# Patient Record
Sex: Female | Born: 1970 | Race: White | Hispanic: No | Marital: Married | Smoking: Former smoker
Health system: Southern US, Community
[De-identification: ages and names within clinical notes are randomized; demographics above are authoritative.]

## PROBLEM LIST (undated history)

## (undated) ENCOUNTER — Ambulatory Visit: Admission: EM | Payer: BC Managed Care – PPO

## (undated) DIAGNOSIS — R011 Cardiac murmur, unspecified: Secondary | ICD-10-CM

## (undated) HISTORY — PX: TONSILLECTOMY: SUR1361

## (undated) HISTORY — DX: Cardiac murmur, unspecified: R01.1

## (undated) HISTORY — PX: ABDOMINAL HYSTERECTOMY: SHX81

---

## 1997-05-01 ENCOUNTER — Other Ambulatory Visit: Admission: RE | Admit: 1997-05-01 | Discharge: 1997-05-01 | Payer: Self-pay | Admitting: Obstetrics & Gynecology

## 1999-05-05 ENCOUNTER — Other Ambulatory Visit: Admission: RE | Admit: 1999-05-05 | Discharge: 1999-05-05 | Payer: Self-pay | Admitting: Obstetrics & Gynecology

## 2000-06-02 ENCOUNTER — Other Ambulatory Visit: Admission: RE | Admit: 2000-06-02 | Discharge: 2000-06-02 | Payer: Self-pay | Admitting: Obstetrics & Gynecology

## 2000-09-06 ENCOUNTER — Other Ambulatory Visit: Admission: RE | Admit: 2000-09-06 | Discharge: 2000-09-06 | Payer: Self-pay | Admitting: Obstetrics & Gynecology

## 2001-02-10 ENCOUNTER — Other Ambulatory Visit: Admission: RE | Admit: 2001-02-10 | Discharge: 2001-02-10 | Payer: Self-pay | Admitting: Obstetrics & Gynecology

## 2001-06-07 ENCOUNTER — Other Ambulatory Visit: Admission: RE | Admit: 2001-06-07 | Discharge: 2001-06-07 | Payer: Self-pay | Admitting: Obstetrics & Gynecology

## 2001-11-29 ENCOUNTER — Other Ambulatory Visit: Admission: RE | Admit: 2001-11-29 | Discharge: 2001-11-29 | Payer: Self-pay | Admitting: Obstetrics and Gynecology

## 2002-04-11 ENCOUNTER — Other Ambulatory Visit: Admission: RE | Admit: 2002-04-11 | Discharge: 2002-04-11 | Payer: Self-pay | Admitting: Obstetrics & Gynecology

## 2002-08-16 ENCOUNTER — Other Ambulatory Visit: Admission: RE | Admit: 2002-08-16 | Discharge: 2002-08-16 | Payer: Self-pay | Admitting: Obstetrics & Gynecology

## 2002-12-01 ENCOUNTER — Other Ambulatory Visit: Admission: RE | Admit: 2002-12-01 | Discharge: 2002-12-01 | Payer: Self-pay | Admitting: Obstetrics & Gynecology

## 2003-05-22 ENCOUNTER — Other Ambulatory Visit: Admission: RE | Admit: 2003-05-22 | Discharge: 2003-05-22 | Payer: Self-pay | Admitting: Obstetrics & Gynecology

## 2003-12-04 ENCOUNTER — Other Ambulatory Visit: Admission: RE | Admit: 2003-12-04 | Discharge: 2003-12-04 | Payer: Self-pay | Admitting: Obstetrics & Gynecology

## 2004-09-10 ENCOUNTER — Other Ambulatory Visit: Admission: RE | Admit: 2004-09-10 | Discharge: 2004-09-10 | Payer: Self-pay | Admitting: Obstetrics & Gynecology

## 2005-03-17 ENCOUNTER — Inpatient Hospital Stay (HOSPITAL_COMMUNITY): Admission: AD | Admit: 2005-03-17 | Discharge: 2005-03-17 | Payer: Self-pay | Admitting: Obstetrics & Gynecology

## 2005-03-20 ENCOUNTER — Inpatient Hospital Stay (HOSPITAL_COMMUNITY): Admission: AD | Admit: 2005-03-20 | Discharge: 2005-03-20 | Payer: Self-pay | Admitting: Obstetrics and Gynecology

## 2005-03-31 ENCOUNTER — Inpatient Hospital Stay (HOSPITAL_COMMUNITY): Admission: AD | Admit: 2005-03-31 | Discharge: 2005-04-02 | Payer: Self-pay | Admitting: Obstetrics and Gynecology

## 2005-04-30 ENCOUNTER — Other Ambulatory Visit: Admission: RE | Admit: 2005-04-30 | Discharge: 2005-04-30 | Payer: Self-pay | Admitting: Obstetrics & Gynecology

## 2012-05-04 ENCOUNTER — Telehealth: Payer: Self-pay | Admitting: Nurse Practitioner

## 2012-05-04 NOTE — Telephone Encounter (Signed)
wtbs today for sore throat, facial pressure.

## 2012-05-05 ENCOUNTER — Ambulatory Visit (INDEPENDENT_AMBULATORY_CARE_PROVIDER_SITE_OTHER): Payer: BC Managed Care – PPO | Admitting: General Practice

## 2012-05-05 ENCOUNTER — Other Ambulatory Visit: Payer: Self-pay | Admitting: *Deleted

## 2012-05-05 ENCOUNTER — Encounter: Payer: Self-pay | Admitting: General Practice

## 2012-05-05 VITALS — BP 129/84 | HR 71 | Temp 98.3°F | Ht 67.0 in | Wt 255.0 lb

## 2012-05-05 DIAGNOSIS — N760 Acute vaginitis: Secondary | ICD-10-CM

## 2012-05-05 DIAGNOSIS — R05 Cough: Secondary | ICD-10-CM

## 2012-05-05 DIAGNOSIS — J322 Chronic ethmoidal sinusitis: Secondary | ICD-10-CM

## 2012-05-05 MED ORDER — FLUCONAZOLE 150 MG PO TABS: 150.0000 mg | ORAL_TABLET | Freq: Once | ORAL | Status: DC

## 2012-05-05 MED ORDER — FLUTICASONE PROPIONATE 50 MCG/ACT NA SUSP: 1.0000 | Freq: Two times a day (BID) | NASAL | Status: DC

## 2012-05-05 MED ORDER — AMOXICILLIN-POT CLAVULANATE 875-125 MG PO TABS: 1.0000 | ORAL_TABLET | Freq: Two times a day (BID) | ORAL | Status: DC

## 2012-05-05 MED ORDER — HYDROCODONE-HOMATROPINE 5-1.5 MG/5ML PO SYRP: 5.0000 mL | ORAL_SOLUTION | Freq: Three times a day (TID) | ORAL | Status: DC | PRN

## 2012-05-05 NOTE — Patient Instructions (Addendum)
Sinusitis Sinusitis is redness, soreness, and swelling (inflammation) of the paranasal sinuses. Paranasal sinuses are air pockets within the bones of your face (beneath the eyes, the middle of the forehead, or above the eyes). In healthy paranasal sinuses, mucus is able to drain out, and air is able to circulate through them by way of your nose. However, when your paranasal sinuses are inflamed, mucus and air can become trapped. This can allow bacteria and other germs to grow and cause infection. Sinusitis can develop quickly and last only a short time (acute) or continue over a long period (chronic). Sinusitis that lasts for more than 12 weeks is considered chronic.  CAUSES  Causes of sinusitis include:  Allergies.  Structural abnormalities, such as displacement of the cartilage that separates your nostrils (deviated septum), which can decrease the air flow through your nose and sinuses and affect sinus drainage.  Functional abnormalities, such as when the small hairs (cilia) that line your sinuses and help remove mucus do not work properly or are not present. SYMPTOMS  Symptoms of acute and chronic sinusitis are the same. The primary symptoms are pain and pressure around the affected sinuses. Other symptoms include:  Upper toothache.  Earache.  Headache.  Bad breath.  Decreased sense of smell and taste.  A cough, which worsens when you are lying flat.  Fatigue.  Fever.  Thick drainage from your nose, which often is green and may contain pus (purulent).  Swelling and warmth over the affected sinuses. DIAGNOSIS  Your caregiver will perform a physical exam. During the exam, your caregiver may:  Look in your nose for signs of abnormal growths in your nostrils (nasal polyps).  Tap over the affected sinus to check for signs of infection.  View the inside of your sinuses (endoscopy) with a special imaging device with a light attached (endoscope), which is inserted into your  sinuses. If your caregiver suspects that you have chronic sinusitis, one or more of the following tests may be recommended:  Allergy tests.  Nasal culture A sample of mucus is taken from your nose and sent to a lab and screened for bacteria.  Nasal cytology A sample of mucus is taken from your nose and examined by your caregiver to determine if your sinusitis is related to an allergy. TREATMENT  Most cases of acute sinusitis are related to a viral infection and will resolve on their own within 10 days. Sometimes medicines are prescribed to help relieve symptoms (pain medicine, decongestants, nasal steroid sprays, or saline sprays).  However, for sinusitis related to a bacterial infection, your caregiver will prescribe antibiotic medicines. These are medicines that will help kill the bacteria causing the infection.  Rarely, sinusitis is caused by a fungal infection. In theses cases, your caregiver will prescribe antifungal medicine. For some cases of chronic sinusitis, surgery is needed. Generally, these are cases in which sinusitis recurs more than 3 times per year, despite other treatments. HOME CARE INSTRUCTIONS   Drink plenty of water. Water helps thin the mucus so your sinuses can drain more easily.  Use a humidifier.  Inhale steam 3 to 4 times a day (for example, sit in the bathroom with the shower running).  Apply a warm, moist washcloth to your face 3 to 4 times a day, or as directed by your caregiver.  Use saline nasal sprays to help moisten and clean your sinuses.  Take over-the-counter or prescription medicines for pain, discomfort, or fever only as directed by your caregiver. SEEK IMMEDIATE MEDICAL   CARE IF:  You have increasing pain or severe headaches.  You have nausea, vomiting, or drowsiness.  You have swelling around your face.  You have vision problems.  You have a stiff neck.  You have difficulty breathing. MAKE SURE YOU:   Understand these  instructions.  Will watch your condition.  Will get help right away if you are not doing well or get worse. Document Released: 01/26/2005 Document Revised: 04/20/2011 Document Reviewed: 02/10/2011 Exit[employer] Patient Information 2013 [employer]. Cough, Adult  A cough is a reflex that helps clear your throat and airways. It can help heal the body or may be a reaction to an irritated airway. A cough may only last 2 or 3 weeks (acute) or may last more than 8 weeks (chronic).  CAUSES Acute cough:  Viral or bacterial infections. Chronic cough:  Infections.  Allergies.  Asthma.  Post-nasal drip.  Smoking.  Heartburn or acid reflux.  Some medicines.  Chronic lung problems (COPD).  Cancer. SYMPTOMS   Cough.  Fever.  Chest pain.  Increased breathing rate.  High-pitched whistling sound when breathing (wheezing).  Colored mucus that you cough up (sputum). TREATMENT   A bacterial cough may be treated with antibiotic medicine.  A viral cough must run its course and will not respond to antibiotics.  Your caregiver may recommend other treatments if you have a chronic cough. HOME CARE INSTRUCTIONS   Only take over-the-counter or prescription medicines for pain, discomfort, or fever as directed by your caregiver. Use cough suppressants only as directed by your caregiver.  Use a cold steam vaporizer or humidifier in your bedroom or home to help loosen secretions.  Sleep in a semi-upright position if your cough is worse at night.  Rest as needed.  Stop smoking if you smoke. SEEK IMMEDIATE MEDICAL CARE IF:   You have pus in your sputum.  Your cough starts to worsen.  You cannot control your cough with suppressants and are losing sleep.  You begin coughing up blood.  You have difficulty breathing.  You develop pain which is getting worse or is uncontrolled with medicine.  You have a fever. MAKE SURE YOU:   Understand these instructions.  Will watch your  condition.  Will get help right away if you are not doing well or get worse. Document Released: 07/25/2010 Document Revised: 04/20/2011 Document Reviewed: 07/25/2010 [employer] Patient Information 2013 [employer].  

## 2012-05-05 NOTE — Progress Notes (Signed)
  Subjective:    Patient ID: Holly Frazier, female    DOB: 06/25/1970, 42 y.o.   MRN: [id number]  HPI Presents today with complaints of sore throat which started on Tuesday. Also sinus pressure and cough which began yesterday. Denies symptoms worse at any given time. OTC cough medication used without success.     Review of Systems  Constitutional: Negative for fever.  HENT: Positive for postnasal drip and sinus pressure. Negative for neck pain.   Eyes: Negative for discharge and itching.  Respiratory: Negative for chest tightness and shortness of breath.   Cardiovascular: Negative for chest pain.  Musculoskeletal: Negative for myalgias.  Skin: Negative for rash.  Neurological: Negative for dizziness and headaches.  Psychiatric/Behavioral: Negative.        Objective:   Physical Exam  Constitutional: She is oriented to person, place, and time. She appears well-developed and well-nourished.  HENT:  Head: Normocephalic and atraumatic.  Right Ear: External ear normal.  Nose: Right sinus exhibits maxillary sinus tenderness and frontal sinus tenderness. Left sinus exhibits maxillary sinus tenderness and frontal sinus tenderness.  Mouth/Throat: Oropharynx is clear and moist.  Left ear canal slightly erythematous   Eyes: Conjunctivae and EOM are normal. Pupils are equal, round, and reactive to light.  Neck: Normal range of motion.  Cardiovascular: Normal rate, regular rhythm and normal heart sounds.   Pulmonary/Chest: Effort normal and breath sounds normal.  Neurological: She is alert and oriented to person, place, and time.  Skin: Skin is warm and dry. No rash noted.  Psychiatric: She has a normal mood and affect.          Assessment & Plan:  Complete full course of antibiotics  Increase fluid intake May use tylenol or motrin for mild discomfort or fever   Raymon Mutton, FNP-C

## 2012-06-23 NOTE — Telephone Encounter (Signed)
appt made for 05/05/12

## 2012-07-13 ENCOUNTER — Telehealth: Payer: Self-pay | Admitting: Family Medicine

## 2012-07-13 ENCOUNTER — Ambulatory Visit (INDEPENDENT_AMBULATORY_CARE_PROVIDER_SITE_OTHER): Payer: BC Managed Care – PPO | Admitting: General Practice

## 2012-07-13 ENCOUNTER — Encounter: Payer: Self-pay | Admitting: General Practice

## 2012-07-13 VITALS — BP 137/79 | HR 85 | Temp 98.3°F | Ht 67.0 in | Wt 257.0 lb

## 2012-07-13 DIAGNOSIS — L255 Unspecified contact dermatitis due to plants, except food: Secondary | ICD-10-CM

## 2012-07-13 DIAGNOSIS — L237 Allergic contact dermatitis due to plants, except food: Secondary | ICD-10-CM

## 2012-07-13 MED ORDER — PREDNISONE (PAK) 10 MG PO TABS: ORAL_TABLET | ORAL | Status: DC

## 2012-07-13 MED ORDER — METHYLPREDNISOLONE ACETATE 80 MG/ML IJ SUSP
80.0000 mg | Freq: Once | INTRAMUSCULAR | Status: AC
  Administered 2012-07-13: 80 mg via INTRAMUSCULAR

## 2012-07-13 MED ORDER — TRIAMCINOLONE ACETONIDE 0.025 % EX OINT: TOPICAL_OINTMENT | Freq: Two times a day (BID) | CUTANEOUS | Status: DC

## 2012-07-13 NOTE — Patient Instructions (Addendum)
Poison Oak Poison oak is an inflammation of the skin (contact dermatitis). It is caused by contact with the allergens on the leaves of the oak (toxicodendron) plants. Depending on your sensitivity, the rash may consist simply of redness and itching, or it may also progress to blisters which may break open (rupture). These must be well cared for to prevent secondary germ (bacterial) infection as these infections can lead to scarring. The eyes may also get puffy. The puffiness is worst in the morning and gets better as the day progresses. Healing is best accomplished by keeping any open areas dry, clean, covered with a bandage, and covered with an antibacterial ointment if needed. Without secondary infection, this dermatitis usually heals without scarring within 2 to 3 weeks without treatment. HOME CARE INSTRUCTIONS When you have been exposed to poison oak, it is very important to thoroughly wash with soap and water as soon as the exposure has been discovered. You have about one half hour to remove the plant resin before it will cause the rash. This cleaning will quickly destroy the oil or antigen on the skin (the antigen is what causes the rash). Wash aggressively under the fingernails as any plant resin still there will continue to spread the rash. Do not rub skin vigorously when washing affected area. Poison oak cannot spread if no oil from the plant remains on your body. Rash that has progressed to weeping sores (lesions) will not spread the rash unless you have not washed thoroughly. It is also important to clean any clothes you have been wearing as they may carry active allergens which will spread the rash, even several days later. Avoidance of the plant in the future is the best measure. Poison oak plants can be recognized by the number of leaves. Generally, poison oak has three leaves with flowering branches on a single stem. Diphenhydramine may be purchased over the counter and used as needed for  itching. Do not drive with this medication if it makes you drowsy. Ask your caregiver about medication for children. SEEK IMMEDIATE MEDICAL CARE IF:   Open areas of the rash develop.  You notice redness extending beyond the area of the rash.  There is a pus like discharge.  There is increased pain.  Other signs of infection develop (such as fever). Document Released: 08/02/2002 Document Revised: 04/20/2011 Document Reviewed: 12/12/2008 Exit[employer] Patient Information 2014 [employer].  

## 2012-07-13 NOTE — Telephone Encounter (Signed)
Appt given for today 

## 2012-07-13 NOTE — Progress Notes (Signed)
  Subjective:    Patient ID: Holly Frazier, female    DOB: 1970/09/02, 42 y.o.   MRN: [id number]  Rash This is a new problem. The current episode started 1 to 4 weeks ago. The problem has been gradually worsening since onset. The affected locations include the left lower leg, right upper leg, left arm and right arm. The rash is characterized by redness and itchiness. She was exposed to plant contact. Pertinent negatives include no congestion, cough, fever, rhinorrhea, shortness of breath or vomiting. Past treatments include anti-itch cream. There is no history of allergies, asthma or eczema.  Reports rash continues to spread.  Review of Systems  Constitutional: Negative for fever.  HENT: Negative for congestion and rhinorrhea.   Respiratory: Negative for cough, chest tightness and shortness of breath.   Cardiovascular: Negative for chest pain and palpitations.  Gastrointestinal: Negative for vomiting.  Skin: Positive for rash.       Red, rash area noted to bilateral upper legs, bilateral arms, and right upper abdomen  All other systems reviewed and are negative.       Objective:   Physical Exam  Constitutional: She is oriented to person, place, and time. She appears well-developed and well-nourished.  Cardiovascular: Normal rate, regular rhythm and normal heart sounds.   Pulmonary/Chest: Effort normal and breath sounds normal. No respiratory distress. She exhibits no tenderness.  Neurological: She is alert and oriented to person, place, and time.  Skin: Skin is warm and dry. Rash noted. There is erythema.  Well demarcated erythematous rash noted to bilateral upper legs, arms and right upper abdomen areas. Negative for drainage or warmth to touch  Psychiatric: She has a normal mood and affect.          Assessment & Plan:  1. Poison oak dermatitis - methylPREDNISolone acetate (DEPO-MEDROL) injection 80 mg; Inject 1 mL (80 mg total) into the muscle once. - predniSONE (STERAPRED  UNI-PAK) 10 MG tablet; Take as directed. Start tomorrow 07/14/12  Dispense: 21 tablet; Refill: 0 -kenalog cream (0.025%) apply to affected areas twice daily. Do not use on face. -avoid irritants -discussed ways to prevent spreading and exposure -Patient verbalized understanding -Coralie Keens, FNP-C

## 2012-11-03 ENCOUNTER — Ambulatory Visit: Payer: BC Managed Care – PPO | Admitting: General Practice

## 2012-11-03 ENCOUNTER — Encounter: Payer: Self-pay | Admitting: General Practice

## 2012-11-03 VITALS — BP 138/74 | HR 99 | Temp 100.0°F | Ht 67.0 in | Wt 256.5 lb

## 2012-11-03 DIAGNOSIS — B373 Candidiasis of vulva and vagina: Secondary | ICD-10-CM

## 2012-11-03 DIAGNOSIS — J322 Chronic ethmoidal sinusitis: Secondary | ICD-10-CM

## 2012-11-03 MED ORDER — FLUCONAZOLE 150 MG PO TABS: 150.0000 mg | ORAL_TABLET | Freq: Once | ORAL | Status: DC

## 2012-11-03 MED ORDER — AMOXICILLIN-POT CLAVULANATE 875-125 MG PO TABS: 1.0000 | ORAL_TABLET | Freq: Two times a day (BID) | ORAL | Status: DC

## 2012-11-03 NOTE — Progress Notes (Signed)
  Subjective:    Patient ID: Holly Frazier, female    DOB: 13-Aug-1970, 42 y.o.   MRN: [id number]  Sinusitis This is a new problem. The current episode started in the past 7 days. The problem has been gradually worsening since onset. There has been no fever. The fever has been present for less than 1 day. Associated symptoms include congestion, coughing and sinus pressure. Pertinent negatives include no chills, shortness of breath or sore throat. Past treatments include oral decongestants.      Review of Systems  Constitutional: Negative for chills.  HENT: Positive for congestion and sinus pressure. Negative for sore throat.   Respiratory: Positive for cough. Negative for shortness of breath.   Cardiovascular: Negative for chest pain and palpitations.  Neurological: Negative for dizziness and weakness.       Objective:   Physical Exam  Constitutional: She appears well-developed and well-nourished.  HENT:  Head: Normocephalic and atraumatic.  Nose: Right sinus exhibits maxillary sinus tenderness and frontal sinus tenderness. Left sinus exhibits maxillary sinus tenderness and frontal sinus tenderness.  Mouth/Throat: Posterior oropharyngeal erythema present.  Cardiovascular: Normal rate, regular rhythm and normal heart sounds.   No murmur heard. Pulmonary/Chest: Effort normal and breath sounds normal. No respiratory distress. She exhibits no tenderness.  Neurological: She is alert.  Skin: Skin is warm and dry. No rash noted.  Psychiatric: She has a normal mood and affect.          Assessment & Plan:  1. Ethmoid sinusitis - amoxicillin-clavulanate (AUGMENTIN) 875-125 MG per tablet; Take 1 tablet by mouth 2 (two) times daily.  Dispense: 20 tablet; Refill: 0  2. Vulvovaginal candidiasis - fluconazole (DIFLUCAN) 150 MG tablet; Take 1 tablet (150 mg total) by mouth once. May repeat in 3 days  Dispense: 2 tablet; Refill: 0 -increase fluid intake -RTO if symptoms worsen or  unresolved -Patient verbalized understanding -Coralie Keens, FNP-C

## 2013-01-24 ENCOUNTER — Other Ambulatory Visit: Payer: Self-pay | Admitting: General Practice

## 2013-01-25 NOTE — Telephone Encounter (Signed)
Last seen 11/03/12  Holly Frazier

## 2013-04-14 ENCOUNTER — Ambulatory Visit (INDEPENDENT_AMBULATORY_CARE_PROVIDER_SITE_OTHER): Payer: BC Managed Care – PPO

## 2013-04-14 ENCOUNTER — Ambulatory Visit (INDEPENDENT_AMBULATORY_CARE_PROVIDER_SITE_OTHER): Payer: BC Managed Care – PPO | Admitting: Nurse Practitioner

## 2013-04-14 ENCOUNTER — Encounter: Payer: Self-pay | Admitting: Nurse Practitioner

## 2013-04-14 VITALS — BP 137/79 | HR 85 | Temp 98.7°F | Ht 67.0 in | Wt 253.0 lb

## 2013-04-14 DIAGNOSIS — M25519 Pain in unspecified shoulder: Secondary | ICD-10-CM

## 2013-04-14 DIAGNOSIS — N390 Urinary tract infection, site not specified: Secondary | ICD-10-CM

## 2013-04-14 DIAGNOSIS — G8929 Other chronic pain: Secondary | ICD-10-CM

## 2013-04-14 DIAGNOSIS — M25511 Pain in right shoulder: Principal | ICD-10-CM

## 2013-04-14 LAB — POCT URINALYSIS DIPSTICK
Bilirubin, UA: NEGATIVE
Blood, UA: NEGATIVE
Glucose, UA: NEGATIVE
Ketones, UA: NEGATIVE
Nitrite, UA: NEGATIVE
Protein, UA: NEGATIVE
Spec Grav, UA: 1.01
Urobilinogen, UA: NEGATIVE
pH, UA: 6.5

## 2013-04-14 LAB — POCT UA - MICROSCOPIC ONLY
Bacteria, U Microscopic: NEGATIVE
Casts, Ur, LPF, POC: NEGATIVE
Crystals, Ur, HPF, POC: NEGATIVE
Mucus, UA: NEGATIVE
RBC, urine, microscopic: NEGATIVE

## 2013-04-14 MED ORDER — DICLOFENAC SODIUM 75 MG PO TBEC: 75.0000 mg | DELAYED_RELEASE_TABLET | Freq: Two times a day (BID) | ORAL | Status: DC

## 2013-04-14 MED ORDER — NITROFURANTOIN MONOHYD MACRO 100 MG PO CAPS: 100.0000 mg | ORAL_CAPSULE | Freq: Two times a day (BID) | ORAL | Status: DC

## 2013-04-14 NOTE — Patient Instructions (Signed)
Urinary Tract Infection  Urinary tract infections (UTIs) can develop anywhere along your urinary tract. Your urinary tract is your body's drainage system for removing wastes and extra water. Your urinary tract includes two kidneys, two ureters, a bladder, and a urethra. Your kidneys are a pair of bean-shaped organs. Each kidney is about the size of your fist. They are located below your ribs, one on each side of your spine.  CAUSES  Infections are caused by microbes, which are microscopic organisms, including fungi, viruses, and bacteria. These organisms are so small that they can only be seen through a microscope. Bacteria are the microbes that most commonly cause UTIs.  SYMPTOMS   Symptoms of UTIs may vary by age and gender of the patient and by the location of the infection. Symptoms in young women typically include a frequent and intense urge to urinate and a painful, burning feeling in the bladder or urethra during urination. Older women and men are more likely to be tired, shaky, and weak and have muscle aches and abdominal pain. A fever may mean the infection is in your kidneys. Other symptoms of a kidney infection include pain in your back or sides below the ribs, nausea, and vomiting.  DIAGNOSIS  To diagnose a UTI, your caregiver will ask you about your symptoms. Your caregiver also will ask to provide a urine sample. The urine sample will be tested for bacteria and white blood cells. White blood cells are made by your body to help fight infection.  TREATMENT   Typically, UTIs can be treated with medication. Because most UTIs are caused by a bacterial infection, they usually can be treated with the use of antibiotics. The choice of antibiotic and length of treatment depend on your symptoms and the type of bacteria causing your infection.  HOME CARE INSTRUCTIONS   If you were prescribed antibiotics, take them exactly as your caregiver instructs you. Finish the medication even if you feel better after you  have only taken some of the medication.   Drink enough water and fluids to keep your urine clear or pale yellow.   Avoid caffeine, tea, and carbonated beverages. They tend to irritate your bladder.   Empty your bladder often. Avoid holding urine for long periods of time.   Empty your bladder before and after sexual intercourse.   After a bowel movement, women should cleanse from front to back. Use each tissue only once.  SEEK MEDICAL CARE IF:    You have back pain.   You develop a fever.   Your symptoms do not begin to resolve within 3 days.  SEEK IMMEDIATE MEDICAL CARE IF:    You have severe back pain or lower abdominal pain.   You develop chills.   You have nausea or vomiting.   You have continued burning or discomfort with urination.  MAKE SURE YOU:    Understand these instructions.   Will watch your condition.   Will get help right away if you are not doing well or get worse.  Document Released: 11/05/2004 Document Revised: 07/28/2011 Document Reviewed: 03/06/2011  [employer] Patient Information 2014 [employer].

## 2013-04-14 NOTE — Progress Notes (Signed)
Subjective:    Patient ID: Holly Frazier, female    DOB: 10-Mar-1970, 43 y.o.   MRN: [id number]  HPI Patient in c/o right back pain below right shoulder blade- When she coughs she has pain  Radiating from there to the front of chest- STarted 3 days ago-hasn't done anything that could has caused pain.Rates pain 5/10.Nothing makes it better or worse. Denies SOB    Review of Systems  Constitutional: Negative.   Respiratory: Negative for cough, chest tightness and shortness of breath.   Cardiovascular: Negative.   Genitourinary: Negative.   Neurological: Negative.   All other systems reviewed and are negative.       Objective:   Physical Exam  Constitutional: She is oriented to person, place, and time. She appears well-developed and well-nourished.  Cardiovascular: Normal rate, regular rhythm and normal heart sounds.   Pulmonary/Chest: Effort normal and breath sounds normal.  Abdominal: Soft. Bowel sounds are normal.  Musculoskeletal:  From of spine without recreating pain  Neurological: She is alert and oriented to person, place, and time.  Skin: Skin is warm and dry.  Psychiatric: She has a normal mood and affect. Her behavior is normal. Judgment and thought content normal.    BP 137/79  Pulse 85  Temp(Src) 98.7 F (37.1 C) (Oral)  Ht 5\' 7"  (1.702 m)  Wt 253 lb (114.76 kg)  BMI 39.62 kg/m2  Chest x ray- normal-Preliminary reading by Ronnald Collum, FNP  [facility] Results for orders placed in visit on 04/14/13  POCT URINALYSIS DIPSTICK      Result Value Ref Range   Color, UA yellow     Clarity, UA clear     Glucose, UA neg     Bilirubin, UA neg     Ketones, UA neg     Spec Grav, UA 1.010     Blood, UA neg     pH, UA 6.5     Protein, UA neg     Urobilinogen, UA negative     Nitrite, UA neg     Leukocytes, UA Trace    POCT UA - MICROSCOPIC ONLY      Result Value Ref Range   WBC, Ur, HPF, POC 5-10     RBC, urine, microscopic neg     Bacteria, U Microscopic neg     Mucus, UA neg     Epithelial cells, urine per micros occ     Crystals, Ur, HPF, POC neg     Casts, Ur, LPF, POC neg     Yeast, UA mod          Assessment & Plan:   1. 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 UTI (urinary tract infection)    Meds ordered this encounter  Medications  . nitrofurantoin, macrocrystal-monohydrate, (MACROBID) 100 MG capsule    Sig: Take 1 capsule (100 mg total) by mouth 2 (two) times daily.    Dispense:  14 capsule    Refill:  0    Order Specific Question:  Supervising Provider    Answer:  Chipper Herb [[id number]]  . diclofenac (VOLTAREN) 75 MG EC tablet    Sig: Take 1 tablet (75 mg total) by mouth 2 (two) times daily.    Dispense:  60 tablet    Refill:  1    Order Specific Question:  Supervising Provider    Answer:  Chipper Herb [[id number]]  moist heat to right scapular area Force fluids AZO over the counter X2 days  RTO prn Culture pending Mary-Margaret Hassell Done, FNP

## 2013-07-20 ENCOUNTER — Telehealth: Payer: Self-pay | Admitting: Family Medicine

## 2013-07-20 NOTE — Telephone Encounter (Signed)
Appt given for tomorrow per patients request 

## 2013-07-21 ENCOUNTER — Ambulatory Visit (INDEPENDENT_AMBULATORY_CARE_PROVIDER_SITE_OTHER): Payer: BC Managed Care – PPO

## 2013-07-21 ENCOUNTER — Encounter: Payer: Self-pay | Admitting: Physician Assistant

## 2013-07-21 ENCOUNTER — Ambulatory Visit (INDEPENDENT_AMBULATORY_CARE_PROVIDER_SITE_OTHER): Payer: BC Managed Care – PPO | Admitting: Physician Assistant

## 2013-07-21 DIAGNOSIS — S99919A Unspecified injury of unspecified ankle, initial encounter: Secondary | ICD-10-CM

## 2013-07-21 DIAGNOSIS — S8990XA Unspecified injury of unspecified lower leg, initial encounter: Secondary | ICD-10-CM

## 2013-07-21 DIAGNOSIS — S99929A Unspecified injury of unspecified foot, initial encounter: Secondary | ICD-10-CM

## 2013-07-21 DIAGNOSIS — S99921A Unspecified injury of right foot, initial encounter: Secondary | ICD-10-CM

## 2013-07-21 NOTE — Patient Instructions (Signed)
Foot Sprain The muscles and cord like structures which attach muscle to bone (tendons) that surround the feet are made up of units. A foot sprain can occur at the weakest spot in any of these units. This condition is most often caused by injury to or overuse of the foot, as from playing contact sports, or aggravating a previous injury, or from poor conditioning, or obesity. SYMPTOMS  Pain with movement of the foot.  Tenderness and swelling at the injury site.  Loss of strength is present in moderate or severe sprains. THE THREE GRADES OR SEVERITY OF FOOT SPRAIN ARE:  Mild (Grade I): Slightly pulled muscle without tearing of muscle or tendon fibers or loss of strength.  Moderate (Grade II): Tearing of fibers in a muscle, tendon, or at the attachment to bone, with small decrease in strength.  Severe (Grade III): Rupture of the muscle-tendon-bone attachment, with separation of fibers. Severe sprain requires surgical repair. Often repeating (chronic) sprains are caused by overuse. Sudden (acute) sprains are caused by direct injury or over-use. DIAGNOSIS  Diagnosis of this condition is usually by your own observation. If problems continue, a caregiver may be required for further evaluation and treatment. X-rays may be required to make sure there are not breaks in the bones (fractures) present. Continued problems may require physical therapy for treatment. PREVENTION  Use strength and conditioning exercises appropriate for your sport.  Warm up properly prior to working out.  Use athletic shoes that are made for the sport you are participating in.  Allow adequate time for healing. Early return to activities makes repeat injury more likely, and can lead to an unstable arthritic foot that can result in prolonged disability. Mild sprains generally heal in 3 to 10 days, with moderate and severe sprains taking 2 to 10 weeks. Your caregiver can help you determine the proper time required for  healing. HOME CARE INSTRUCTIONS   Apply ice to the injury for 15-20 minutes, 03-04 times per day. Put the ice in a plastic bag and place a towel between the bag of ice and your skin.  An elastic wrap (like an Ace bandage) may be used to keep swelling down.  Keep foot above the level of the heart, or at least raised on a footstool, when swelling and pain are present.  Try to avoid use other than gentle range of motion while the foot is painful. Do not resume use until instructed by your caregiver. Then begin use gradually, not increasing use to the point of pain. If pain does develop, decrease use and continue the above measures, gradually increasing activities that do not cause discomfort, until you gradually achieve normal use.  Use crutches if and as instructed, and for the length of time instructed.  Keep injured foot and ankle wrapped between treatments.  Massage foot and ankle for comfort and to keep swelling down. Massage from the toes up towards the knee.  Only take over-the-counter or prescription medicines for pain, discomfort, or fever as directed by your caregiver. SEEK IMMEDIATE MEDICAL CARE IF:   Your pain and swelling increase, or pain is not controlled with medications.  You have loss of feeling in your foot or your foot turns cold or blue.  You develop new, unexplained symptoms, or an increase of the symptoms that brought you to your caregiver. MAKE SURE YOU:   Understand these instructions.  Will watch your condition.  Will get help right away if you are not doing well or get worse. Document Released:   07/18/2001 Document Revised: 04/20/2011 Document Reviewed: 09/15/2007 [employer] Patient Information 2014 [employer].  

## 2013-07-21 NOTE — Progress Notes (Signed)
Subjective:     Patient ID: Holly Frazier, female   DOB: Jan 19, 1971, 43 y.o.   MRN: [id number]  HPI Pt states that 1 yr ago she stepped through a wood floor injuring the R foot She had bruising and some swelling to the lateral foot Here due to cont pain that is worse after sitting or laying for a period of time Sx improve after being up and moving  Review of Systems No further bruising or swelling to the foot No further trauma No numbness to the foot    Objective:   Physical Exam No ecchy/edema to the foot No TTP of the foot/ankle Good strength FROM of the foot/sl sx in inversion/eversion Good pulses/sensory Xray- no fx    Assessment:     Foot pain    Plan:     Heat/Ice OTC meds for sx relief ROM and strengthening exercises If sx cont refer to Ortho

## 2014-10-23 ENCOUNTER — Ambulatory Visit (INDEPENDENT_AMBULATORY_CARE_PROVIDER_SITE_OTHER): Payer: BLUE CROSS/BLUE SHIELD | Admitting: Family Medicine

## 2014-10-23 ENCOUNTER — Ambulatory Visit (INDEPENDENT_AMBULATORY_CARE_PROVIDER_SITE_OTHER): Payer: BLUE CROSS/BLUE SHIELD

## 2014-10-23 ENCOUNTER — Encounter: Payer: Self-pay | Admitting: Family Medicine

## 2014-10-23 VITALS — BP 127/80 | HR 68 | Temp 98.7°F | Ht 67.0 in | Wt 235.0 lb

## 2014-10-23 DIAGNOSIS — M222X1 Patellofemoral disorders, right knee: Secondary | ICD-10-CM | POA: Diagnosis not present

## 2014-10-23 DIAGNOSIS — M25561 Pain in right knee: Secondary | ICD-10-CM | POA: Diagnosis not present

## 2014-10-23 MED ORDER — MELOXICAM 15 MG PO TABS: 15.0000 mg | ORAL_TABLET | Freq: Every day | ORAL | Status: DC

## 2014-10-23 NOTE — Progress Notes (Signed)
   Subjective:    Patient ID: Holly Frazier, female    DOB: 03-May-1970, 44 y.o.   MRN: [id number]  HPI 44 year old female with right knee pain. There is been no history of injury. There is no swelling or redness. The pain is described as burning. Seems to be present when she sits and and/or stands up and seems to be more medial than lateral.  There are no active problems to display for this patient.  Outpatient Encounter Prescriptions as of 10/23/2014  Medication Sig  . cephALEXin (KEFLEX) 500 MG capsule Take 500 mg by mouth 2 (two) times daily.  Marland Kitchen spironolactone (ALDACTONE) 50 MG tablet Take 50 mg by mouth daily.  . Triamcinolone Acetonide (NASACORT AQ NA) Place into the nose.  Marland Kitchen Doxycycline Hyclate 200 MG TBEC Take 1 tablet by mouth daily.  . [DISCONTINUED] diclofenac (VOLTAREN) 75 MG EC tablet Take 1 tablet (75 mg total) by mouth 2 (two) times daily.  . [DISCONTINUED] fluticasone (FLONASE) 50 MCG/ACT nasal spray Place 1 spray into the nose 2 (two) times daily. One spray each nostril twice a day  . [DISCONTINUED] nitrofurantoin, macrocrystal-monohydrate, (MACROBID) 100 MG capsule Take 1 capsule (100 mg total) by mouth 2 (two) times daily.   No facility-administered encounter medications on file as of 10/23/2014.      Review of Systems  Musculoskeletal: Positive for arthralgias.       Objective:   Physical Exam  Musculoskeletal:  Right knee: There is no effusion. Knee is stable to valgus and varus stress. Drawer sign is negative. There is no palpable crepitance. There is slight tenderness when the joint was palpated on the medial aspect. Patella seems to track okay in the patellofemoral groove          Assessment & Plan:  1. Knee pain, acute, right  - DG Knee 1-2 Views Right; Future  2. Patellofemoral syndrome, right With negative x-rays and exam I assume the problem is some variant of patellofemoral syndrome. I would like to get this taped but physical therapy is unable to  accommodate for at least 10 days. In lieu of that, I will give her some exercises and a prescription for meloxicam 15 mg for 2 weeks. I have also suggested she is a cup a neoprene sleeve at the pharmacy. If symptoms are not improved after 2 weeks will consider orthopedic consult

## 2015-01-08 ENCOUNTER — Telehealth: Payer: Self-pay | Admitting: Nurse Practitioner

## 2015-01-08 NOTE — Telephone Encounter (Signed)
No. Her work is doing them for free.

## 2015-08-12 ENCOUNTER — Telehealth: Payer: Self-pay | Admitting: Family Medicine

## 2015-08-12 MED ORDER — FLUCONAZOLE 150 MG PO TABS: 150.0000 mg | ORAL_TABLET | Freq: Once | ORAL | Status: DC

## 2015-08-12 NOTE — Telephone Encounter (Signed)
Eye doctors typically used topical antibiotics rather than oral maintenance details if it is oral antibiotic no problem with Diflucan 150 mg one time dose

## 2015-08-12 NOTE — Telephone Encounter (Signed)
rx sent and patient aware.  

## 2015-08-12 NOTE — Addendum Note (Signed)
Addended by: Nigel Berthold C on: 08/12/2015 04:56 PM   Modules accepted: Orders

## 2015-08-27 ENCOUNTER — Encounter: Payer: Self-pay | Admitting: Family

## 2015-08-27 ENCOUNTER — Encounter (INDEPENDENT_AMBULATORY_CARE_PROVIDER_SITE_OTHER): Payer: Self-pay

## 2015-08-27 ENCOUNTER — Ambulatory Visit (INDEPENDENT_AMBULATORY_CARE_PROVIDER_SITE_OTHER): Payer: BLUE CROSS/BLUE SHIELD | Admitting: Family

## 2015-08-27 VITALS — BP 128/74 | HR 72 | Temp 97.9°F | Ht 67.0 in | Wt 234.2 lb

## 2015-08-27 DIAGNOSIS — IMO0001 Reserved for inherently not codable concepts without codable children: Secondary | ICD-10-CM

## 2015-08-27 DIAGNOSIS — Z0000 Encounter for general adult medical examination without abnormal findings: Secondary | ICD-10-CM | POA: Diagnosis not present

## 2015-08-27 DIAGNOSIS — R030 Elevated blood-pressure reading, without diagnosis of hypertension: Secondary | ICD-10-CM

## 2015-08-27 DIAGNOSIS — E669 Obesity, unspecified: Secondary | ICD-10-CM

## 2015-08-27 NOTE — Patient Instructions (Signed)
Health Maintenance, Female Adopting a healthy lifestyle and getting preventive care can go a long way to promote health and wellness. Talk with your health care provider about what schedule of regular examinations is right for you. This is a good chance for you to check in with your provider about disease prevention and staying healthy. In between checkups, there are plenty of things you can do on your own. Experts have done a lot of research about which lifestyle changes and preventive measures are most likely to keep you healthy. Ask your health care provider for more information. WEIGHT AND DIET  Eat a healthy diet  Be sure to include plenty of vegetables, fruits, low-fat dairy products, and lean protein.  Do not eat a lot of foods high in solid fats, added sugars, or salt.  Get regular exercise. This is one of the most important things you can do for your health.  Most adults should exercise for at least 150 minutes each week. The exercise should increase your heart rate and make you sweat (moderate-intensity exercise).  Most adults should also do strengthening exercises at least twice a week. This is in addition to the moderate-intensity exercise.  Maintain a healthy weight  Body mass index (BMI) is a measurement that can be used to identify possible weight problems. It estimates body fat based on height and weight. Your health care provider can help determine your BMI and help you achieve or maintain a healthy weight.  For females 20 years of age and older:   A BMI below 18.5 is considered underweight.  A BMI of 18.5 to 24.9 is normal.  A BMI of 25 to 29.9 is considered overweight.  A BMI of 30 and above is considered obese.  Watch levels of cholesterol and blood lipids  You should start having your blood tested for lipids and cholesterol at 46 years of age, then have this test every 5 years.  You may need to have your cholesterol levels checked more often if:  Your lipid  or cholesterol levels are high.  You are older than 46 years of age.  You are at high risk for heart disease.  CANCER SCREENING   Lung Cancer  Lung cancer screening is recommended for adults 55-80 years old who are at high risk for lung cancer because of a history of smoking.  A yearly low-dose CT scan of the lungs is recommended for people who:  Currently smoke.  Have quit within the past 15 years.  Have at least a 30-pack-year history of smoking. A pack year is smoking an average of one pack of cigarettes a day for 1 year.  Yearly screening should continue until it has been 15 years since you quit.  Yearly screening should stop if you develop a health problem that would prevent you from having lung cancer treatment.  Breast Cancer  Practice breast self-awareness. This means understanding how your breasts normally appear and feel.  It also means doing regular breast self-exams. Let your health care provider know about any changes, no matter how small.  If you are in your 20s or 30s, you should have a clinical breast exam (CBE) by a health care provider every 1-3 years as part of a regular health exam.  If you are 40 or older, have a CBE every year. Also consider having a breast X-ray (mammogram) every year.  If you have a family history of breast cancer, talk to your health care provider about genetic screening.  If you   are at high risk for breast cancer, talk to your health care provider about having an MRI and a mammogram every year.  Breast cancer gene (BRCA) assessment is recommended for women who have family members with BRCA-related cancers. BRCA-related cancers include:  Breast.  Ovarian.  Tubal.  Peritoneal cancers.  Results of the assessment will determine the need for genetic counseling and BRCA1 and BRCA2 testing. Cervical Cancer Your health care provider may recommend that you be screened regularly for cancer of the pelvic organs (ovaries, uterus, and  vagina). This screening involves a pelvic examination, including checking for microscopic changes to the surface of your cervix (Pap test). You may be encouraged to have this screening done every 3 years, beginning at age 21.  For women ages 30-65, health care providers may recommend pelvic exams and Pap testing every 3 years, or they may recommend the Pap and pelvic exam, combined with testing for human papilloma virus (HPV), every 5 years. Some types of HPV increase your risk of cervical cancer. Testing for HPV may also be done on women of any age with unclear Pap test results.  Other health care providers may not recommend any screening for nonpregnant women who are considered low risk for pelvic cancer and who do not have symptoms. Ask your health care provider if a screening pelvic exam is right for you.  If you have had past treatment for cervical cancer or a condition that could lead to cancer, you need Pap tests and screening for cancer for at least 20 years after your treatment. If Pap tests have been discontinued, your risk factors (such as having a new sexual partner) need to be reassessed to determine if screening should resume. Some women have medical problems that increase the chance of getting cervical cancer. In these cases, your health care provider may recommend more frequent screening and Pap tests. Colorectal Cancer  This type of cancer can be detected and often prevented.  Routine colorectal cancer screening usually begins at 46 years of age and continues through 46 years of age.  Your health care provider may recommend screening at an earlier age if you have risk factors for colon cancer.  Your health care provider may also recommend using home test kits to check for hidden blood in the stool.  A small camera at the end of a tube can be used to examine your colon directly (sigmoidoscopy or colonoscopy). This is done to check for the earliest forms of colorectal  cancer.  Routine screening usually begins at age 50.  Direct examination of the colon should be repeated every 5-10 years through 46 years of age. However, you may need to be screened more often if early forms of precancerous polyps or small growths are found. Skin Cancer  Check your skin from head to toe regularly.  Tell your health care provider about any new moles or changes in moles, especially if there is a change in a mole's shape or color.  Also tell your health care provider if you have a mole that is larger than the size of a pencil eraser.  Always use sunscreen. Apply sunscreen liberally and repeatedly throughout the day.  Protect yourself by wearing long sleeves, pants, a wide-brimmed hat, and sunglasses whenever you are outside. HEART DISEASE, DIABETES, AND HIGH BLOOD PRESSURE   High blood pressure causes heart disease and increases the risk of stroke. High blood pressure is more likely to develop in:  People who have blood pressure in the high end   of the normal range (130-139/85-89 mm Hg).  People who are overweight or obese.  People who are African American.  If you are 38-23 years of age, have your blood pressure checked every 3-5 years. If you are 61 years of age or older, have your blood pressure checked every year. You should have your blood pressure measured twice--once when you are at a hospital or clinic, and once when you are not at a hospital or clinic. Record the average of the two measurements. To check your blood pressure when you are not at a hospital or clinic, you can use:  An automated blood pressure machine at a pharmacy.  A home blood pressure monitor.  If you are between 45 years and 39 years old, ask your health care provider if you should take aspirin to prevent strokes.  Have regular diabetes screenings. This involves taking a blood sample to check your fasting blood sugar level.  If you are at a normal weight and have a low risk for diabetes,  have this test once every three years after 46 years of age.  If you are overweight and have a high risk for diabetes, consider being tested at a younger age or more often. PREVENTING INFECTION  Hepatitis B  If you have a higher risk for hepatitis B, you should be screened for this virus. You are considered at high risk for hepatitis B if:  You were born in a country where hepatitis B is common. Ask your health care provider which countries are considered high risk.  Your parents were born in a high-risk country, and you have not been immunized against hepatitis B (hepatitis B vaccine).  You have HIV or AIDS.  You use needles to inject street drugs.  You live with someone who has hepatitis B.  You have had sex with someone who has hepatitis B.  You get hemodialysis treatment.  You take certain medicines for conditions, including cancer, organ transplantation, and autoimmune conditions. Hepatitis C  Blood testing is recommended for:  Everyone born from [birth year] through 1966.  Anyone with known risk factors for hepatitis C. Sexually transmitted infections (STIs)  You should be screened for sexually transmitted infections (STIs) including gonorrhea and chlamydia if:  You are sexually active and are younger than 46 years of age.  You are older than 46 years of age and your health care provider tells you that you are at risk for this type of infection.  Your sexual activity has changed since you were last screened and you are at an increased risk for chlamydia or gonorrhea. Ask your health care provider if you are at risk.  If you do not have HIV, but are at risk, it may be recommended that you take a prescription medicine daily to prevent HIV infection. This is called pre-exposure prophylaxis (PrEP). You are considered at risk if:  You are sexually active and do not regularly use condoms or know the HIV status of your partner(s).  You take drugs by injection.  You are sexually  active with a partner who has HIV. Talk with your health care provider about whether you are at high risk of being infected with HIV. If you choose to begin PrEP, you should first be tested for HIV. You should then be tested every 3 months for as long as you are taking PrEP.  PREGNANCY   If you are premenopausal and you may become pregnant, ask your health care provider about preconception counseling.  If you may  become pregnant, take 400 to 800 micrograms (mcg) of folic acid every day.  If you want to prevent pregnancy, talk to your health care provider about birth control (contraception). OSTEOPOROSIS AND MENOPAUSE   Osteoporosis is a disease in which the bones lose minerals and strength with aging. This can result in serious bone fractures. Your risk for osteoporosis can be identified using a bone density scan.  If you are 61 years of age or older, or if you are at risk for osteoporosis and fractures, ask your health care provider if you should be screened.  Ask your health care provider whether you should take a calcium or vitamin D supplement to lower your risk for osteoporosis.  Menopause may have certain physical symptoms and risks.  Hormone replacement therapy may reduce some of these symptoms and risks. Talk to your health care provider about whether hormone replacement therapy is right for you.  HOME CARE INSTRUCTIONS   Schedule regular health, dental, and eye exams.  Stay current with your immunizations.   Do not use any tobacco products including cigarettes, chewing tobacco, or electronic cigarettes.  If you are pregnant, do not drink alcohol.  If you are breastfeeding, limit how much and how often you drink alcohol.  Limit alcohol intake to no more than 1 drink per day for nonpregnant women. One drink equals 12 ounces of beer, 5 ounces of wine, or 1 ounces of hard liquor.  Do not use street drugs.  Do not share needles.  Ask your health care provider for help if  you need support or information about quitting drugs.  Tell your health care provider if you often feel depressed.  Tell your health care provider if you have ever been abused or do not feel safe at home.   This information is not intended to replace advice given to you by your health care provider. Make sure you discuss any questions you have with your health care provider.   Document Released: 08/11/2010 Document Revised: 02/16/2014 Document Reviewed: 12/28/2012 Elsevier Interactive Patient Education [employer].

## 2015-08-27 NOTE — Progress Notes (Signed)
   Subjective:    Patient ID: Holly Frazier, female    DOB: 1970/07/03, 45 y.o.   MRN: [id number]  HPI Pt presents to the office today to have BP checked and have blood work drawn. PT states the last two years she has been to her gyn she had an elevated BP. PT's BP is perfect today. Pt currently only has mirena in and denies any  palpitations, SOB, or edema at this time.     Review of Systems  Constitutional: Negative.   HENT: Negative.   Eyes: Negative.   Respiratory: Negative.  Negative for shortness of breath.   Cardiovascular: Negative.  Negative for palpitations.  Gastrointestinal: Negative.   Endocrine: Negative.   Genitourinary: Negative.   Musculoskeletal: Negative.   Neurological: Negative.  Negative for headaches.  Hematological: Negative.   Psychiatric/Behavioral: Negative.   All other systems reviewed and are negative.      Objective:   Physical Exam  Constitutional: She is oriented to person, place, and time. She appears well-developed and well-nourished. No distress.  HENT:  Head: Normocephalic and atraumatic.  Right Ear: External ear normal.  Left Ear: External ear normal.  Nose: Nose normal.  Mouth/Throat: Oropharynx is clear and moist.  Eyes: Pupils are equal, round, and reactive to light.  Neck: Normal range of motion. Neck supple. No thyromegaly present.  Cardiovascular: Normal rate, regular rhythm, normal heart sounds and intact distal pulses.   No murmur heard. Pulmonary/Chest: Effort normal and breath sounds normal. No respiratory distress. She has no wheezes.  Abdominal: Soft. Bowel sounds are normal. She exhibits no distension. There is no tenderness.  Musculoskeletal: Normal range of motion. She exhibits no edema or tenderness.  Neurological: She is alert and oriented to person, place, and time.  Skin: Skin is warm and dry.  Psychiatric: She has a normal mood and affect. Her behavior is normal. Judgment and thought content normal.  Vitals  reviewed.     BP 128/74 mmHg  Pulse 72  Temp(Src) 97.9 F (36.6 C) (Oral)  Ht 5' 7" (1.702 m)  Wt 234 lb 3.2 oz (106.232 kg)  BMI 36.67 kg/m2     Assessment & Plan:  1. Obesity (BMI 30-39.9) - CMP14+EGFR  2. Elevated blood pressure - CMP14+EGFR  3. Physical exam - CMP14+EGFR  4. Laboratory tests ordered as part of a complete physical exam (CPE) - CMP14+EGFR - Lipid panel - Thyroid Panel With TSH - Anemia Profile B   Continue all meds Labs pending Health Maintenance reviewed Diet and exercise encouraged RTO 1 year  Evelina Dun, FNP

## 2015-08-28 LAB — ANEMIA PROFILE B
BASOS ABS: 0 10*3/uL (ref 0.0–0.2)
Basos: 0 %
EOS (ABSOLUTE): 0.1 10*3/uL (ref 0.0–0.4)
EOS: 1 %
Ferritin: 224 ng/mL — ABNORMAL HIGH (ref 15–150)
HEMATOCRIT: 40.4 % (ref 34.0–46.6)
Hemoglobin: 13.9 g/dL (ref 11.1–15.9)
IMMATURE GRANULOCYTES: 0 %
IRON SATURATION: 23 % (ref 15–55)
IRON: 74 ug/dL (ref 27–159)
Immature Grans (Abs): 0 10*3/uL (ref 0.0–0.1)
LYMPHS ABS: 1.7 10*3/uL (ref 0.7–3.1)
Lymphs: 23 %
MCH: 30 pg (ref 26.6–33.0)
MCHC: 34.4 g/dL (ref 31.5–35.7)
MCV: 87 fL (ref 79–97)
MONOCYTES: 7 %
Monocytes Absolute: 0.5 10*3/uL (ref 0.1–0.9)
NEUTROS ABS: 5 10*3/uL (ref 1.4–7.0)
NEUTROS PCT: 69 %
PLATELETS: 252 10*3/uL (ref 150–379)
RBC: 4.64 x10E6/uL (ref 3.77–5.28)
RDW: 13.2 % (ref 12.3–15.4)
RETIC CT PCT: 1 % (ref 0.6–2.6)
Total Iron Binding Capacity: 315 ug/dL (ref 250–450)
UIBC: 241 ug/dL (ref 131–425)
VITAMIN B 12: 331 pg/mL (ref 211–946)
WBC: 7.4 10*3/uL (ref 3.4–10.8)

## 2015-08-28 LAB — CMP14+EGFR
A/G RATIO: 1.7 (ref 1.2–2.2)
ALBUMIN: 4.2 g/dL (ref 3.5–5.5)
ALT: 14 IU/L (ref 0–32)
AST: 15 IU/L (ref 0–40)
Alkaline Phosphatase: 79 IU/L (ref 39–117)
BUN / CREAT RATIO: 15 (ref 9–23)
BUN: 12 mg/dL (ref 6–24)
Bilirubin Total: 0.4 mg/dL (ref 0.0–1.2)
CALCIUM: 9.3 mg/dL (ref 8.7–10.2)
CO2: 24 mmol/L (ref 18–29)
CREATININE: 0.82 mg/dL (ref 0.57–1.00)
Chloride: 102 mmol/L (ref 96–106)
GFR, EST AFRICAN AMERICAN: 100 mL/min/{1.73_m2} (ref >59)
GFR, EST NON AFRICAN AMERICAN: 87 mL/min/{1.73_m2} (ref >59)
GLOBULIN, TOTAL: 2.5 g/dL (ref 1.5–4.5)
Glucose: 92 mg/dL (ref 65–99)
Potassium: 4.5 mmol/L (ref 3.5–5.2)
SODIUM: 139 mmol/L (ref 134–144)
Total Protein: 6.7 g/dL (ref 6.0–8.5)

## 2015-08-28 LAB — LIPID PANEL
CHOL/HDL RATIO: 3.7 ratio (ref 0.0–4.4)
Cholesterol, Total: 149 mg/dL (ref 100–199)
HDL: 40 mg/dL (ref >39)
LDL CALC: 92 mg/dL (ref 0–99)
Triglycerides: 87 mg/dL (ref 0–149)
VLDL Cholesterol Cal: 17 mg/dL (ref 5–40)

## 2015-08-28 LAB — THYROID PANEL WITH TSH
Free Thyroxine Index: 1.8 (ref 1.2–4.9)
T3 UPTAKE RATIO: 26 % (ref 24–39)
T4 TOTAL: 6.8 ug/dL (ref 4.5–12.0)
TSH: 2.38 u[IU]/mL (ref 0.450–4.500)

## 2018-03-18 ENCOUNTER — Telehealth: Payer: Self-pay | Admitting: Family

## 2018-03-18 MED ORDER — OSELTAMIVIR PHOSPHATE 75 MG PO CAPS: 75.0000 mg | ORAL_CAPSULE | Freq: Every day | ORAL | 0 refills | Status: DC

## 2018-03-18 NOTE — Telephone Encounter (Signed)
Patient aware, script is ready. 

## 2018-03-18 NOTE — Telephone Encounter (Signed)
Prescription sent to pharmacy. Take daily, unless you develop flu like symptoms then increase twice a day until prescription is complete.

## 2020-02-09 ENCOUNTER — Ambulatory Visit (INDEPENDENT_AMBULATORY_CARE_PROVIDER_SITE_OTHER): Payer: BC Managed Care – PPO

## 2020-02-09 ENCOUNTER — Ambulatory Visit
Admission: EM | Admit: 2020-02-09 | Discharge: 2020-02-09 | Disposition: A | Discharge status: Home / Self Care | Payer: BC Managed Care – PPO | Attending: Family Medicine | Admitting: Family Medicine

## 2020-02-09 DIAGNOSIS — M25512 Pain in left shoulder: Secondary | ICD-10-CM

## 2020-02-09 DIAGNOSIS — W19XXXA Unspecified fall, initial encounter: Secondary | ICD-10-CM | POA: Diagnosis not present

## 2020-02-09 MED ORDER — PREDNISONE 10 MG (21) PO TBPK: ORAL_TABLET | ORAL | 0 refills | Status: DC

## 2020-02-09 MED ORDER — CYCLOBENZAPRINE HCL 5 MG PO TABS: 5.0000 mg | ORAL_TABLET | Freq: Three times a day (TID) | ORAL | 0 refills | Status: DC | PRN

## 2020-02-09 MED ORDER — KETOROLAC TROMETHAMINE 30 MG/ML IJ SOLN
30.0000 mg | Freq: Once | INTRAMUSCULAR | Status: AC
  Administered 2020-02-09: 30 mg via INTRAMUSCULAR

## 2020-02-09 NOTE — ED Triage Notes (Signed)
Pt presents with left shoulder pain and some arm numbness for past couple days , denies injury

## 2020-02-09 NOTE — Discharge Instructions (Addendum)
Your x ray was normal Believe this is a pinched nerve  Toradol given here for pain.  Prednisone taper over the next 6 days.  Muscle relaxant as needed.  Follow up as needed for continued or worsening symptoms

## 2020-02-12 NOTE — ED Provider Notes (Signed)
[facility]    CSN: [id number] Arrival date & time: 02/09/20  [id number]      History   Chief Complaint Chief Complaint  Holly presents with  . Shoulder Pain    HPI Holly Frazier is a 50 y.o. Frazier.   Holly Frazier who presents today with left shoulder pain.  This is been present for the past couple days.  Some associated numbness and tingling in the arm.  Pain worse with certain movements.  Pain to the trapezius area.  No injuries or falls.  Taking over-the-counter anti-inflammatories without much relief.  Denies any chest pain, cough or shortness of breath     Past Medical History:  Diagnosis Date  . Heart murmur     Holly Active Problem List   Diagnosis Date Noted  . Obesity (BMI 30-39.9) 08/27/2015    Past Surgical History:  Procedure Laterality Date  . TONSILLECTOMY      OB History   No obstetric history on file.      Home Medications    Prior to Admission medications   Medication Sig Start Date End Date Taking? Authorizing Provider  cyclobenzaprine (FLEXERIL) 5 MG tablet Take 1 tablet (5 mg total) by mouth 3 (three) times daily as needed for muscle spasms. 02/09/20  Yes Estanislado Surgeon A, NP  predniSONE (STERAPRED UNI-PAK 21 TAB) 10 MG (21) TBPK tablet 6 tabs for 1 day, then 5 tabs for 1 das, then 4 tabs for 1 day, then 3 tabs for 1 day, 2 tabs for 1 day, then 1 tab for 1 day 02/09/20  Yes Sheppard Luckenbach A, NP  levonorgestrel (MIRENA) 20 MCG/24HR IUD 1 each by Intrauterine route once.    [provider]  oseltamivir (TAMIFLU) 75 MG capsule Take 1 capsule (75 mg total) by mouth daily. 03/18/18   Sharion Balloon, FNP    Family History Family History  Problem Relation Age of Onset  . Hypertension Father   . Hyperlipidemia Father   . Deep vein thrombosis Father     Social History Social History   Tobacco Use  . Smoking status: Former Smoker    Years: 20.00    Quit date: 02/09/2006    Years since quitting: 14.0  .  Smokeless tobacco: Never Used  Substance Use Topics  . Alcohol use: Not Currently  . Drug use: Never     Allergies   Orange fruit [citrus]   Review of Systems Review of Systems   Physical Exam Triage Vital Signs ED Triage Vitals  Enc Vitals Group     BP 02/09/20 0858 (!) 167/117     Pulse Rate 02/09/20 0858 73     Resp 02/09/20 0858 15     Temp 02/09/20 0858 98.1 F (36.7 C)     Temp src --      SpO2 02/09/20 0858 98 %     Weight --      Height --      Head Circumference --      Peak Flow --      Pain Score 02/09/20 0905 6     Pain Loc --      Pain Edu? --      Excl. in [place]? --    No data found.  Updated Vital Signs BP (!) 167/117   Pulse 73   Temp 98.1 F (36.7 C)   Resp 15   SpO2 98%   Visual Acuity Right Eye Distance:   Left Eye  Distance:   Bilateral Distance:    Right Eye Near:   Left Eye Near:    Bilateral Near:     Physical Exam Vitals and nursing note reviewed.  Constitutional:      General: Holly is not in acute distress.    Appearance: Normal appearance. Holly is not ill-appearing, toxic-appearing or diaphoretic.  HENT:     Head: Normocephalic.     Nose: Nose normal.  Eyes:     Conjunctiva/sclera: Conjunctivae normal.  Pulmonary:     Effort: Pulmonary effort is normal.  Musculoskeletal:        General: Normal range of motion.     Cervical back: Normal range of motion.     Comments: Generalized tenderness to palpation of left shoulder trapezius area.  Limited range of motion  Skin:    General: Skin is warm and dry.     Findings: No rash.  Neurological:     Mental Status: Holly is alert.  Psychiatric:        Mood and Affect: Mood normal.      UC Treatments / Results  Labs (all labs ordered are listed, but only abnormal results are displayed) Labs Reviewed - No data to display  EKG   Radiology No results found.  Procedures Procedures (including critical care time)  Medications Ordered in UC Medications  ketorolac  (TORADOL) 30 MG/ML injection 30 mg (30 mg Intramuscular Given 02/09/20 0958)    Initial Impression / Assessment and Plan / UC Course  I have reviewed the triage vital signs and the nursing notes.  Pertinent labs & imaging results that were available during my care of the Holly were reviewed by me and considered in my medical decision making (see chart for details).     Shoulder pain X-ray without any acute findings.  Most likely some muscle tension and nerve inflammation.  May be pinched nerve Toradol given here for pain.  Will place on prednisone taper over the next 6 days. Prescribed muscle relaxant to use as needed. Follow up as needed for continued or worsening symptoms  Final Clinical Impressions(s) / UC Diagnoses   Final diagnoses:  Pain in joint of left shoulder     Discharge Instructions     Your x ray was normal Believe this is a pinched nerve  Toradol given here for pain.  Prednisone taper over the next 6 days.  Muscle relaxant as needed.  Follow up as needed for continued or worsening symptoms     ED Prescriptions    Medication Sig Dispense Auth. Provider   predniSONE (STERAPRED UNI-PAK 21 TAB) 10 MG (21) TBPK tablet 6 tabs for 1 day, then 5 tabs for 1 das, then 4 tabs for 1 day, then 3 tabs for 1 day, 2 tabs for 1 day, then 1 tab for 1 day 21 tablet Gal Smolinski A, NP   cyclobenzaprine (FLEXERIL) 5 MG tablet Take 1 tablet (5 mg total) by mouth 3 (three) times daily as needed for muscle spasms. 30 tablet Dahlia Byes A, NP     PDMP not reviewed this encounter.   Janace Aris, NP 02/12/20 1614

## 2020-06-05 ENCOUNTER — Ambulatory Visit
Admission: RE | Admit: 2020-06-05 | Discharge: 2020-06-05 | Disposition: A | Discharge status: Home / Self Care | Payer: BC Managed Care – PPO | Source: Ambulatory Visit | Attending: Family Medicine | Admitting: Family Medicine

## 2020-06-05 ENCOUNTER — Other Ambulatory Visit: Payer: Self-pay

## 2020-06-05 VITALS — BP 164/107 | HR 74 | Temp 98.4°F | Resp 16

## 2020-06-05 DIAGNOSIS — I10 Essential (primary) hypertension: Secondary | ICD-10-CM

## 2020-06-05 DIAGNOSIS — R350 Frequency of micturition: Secondary | ICD-10-CM | POA: Diagnosis not present

## 2020-06-05 LAB — POCT URINALYSIS DIP (MANUAL ENTRY)
Bilirubin, UA: NEGATIVE
Blood, UA: NEGATIVE
Glucose, UA: NEGATIVE mg/dL
Ketones, POC UA: NEGATIVE mg/dL
Leukocytes, UA: NEGATIVE
Nitrite, UA: NEGATIVE
Protein Ur, POC: NEGATIVE mg/dL
Spec Grav, UA: 1.01 (ref 1.010–1.025)
Urobilinogen, UA: 0.2 E.U./dL
pH, UA: 6.5 (ref 5.0–8.0)

## 2020-06-05 MED ORDER — CYCLOBENZAPRINE HCL 5 MG PO TABS: 5.0000 mg | ORAL_TABLET | Freq: Every day | ORAL | 0 refills | Status: DC

## 2020-06-05 NOTE — ED Triage Notes (Signed)
Pt present urinary frequency with right side flank pain. Pt states she is feeling a lot of pressure and symptom started on Monday. Pt has taking AZO for relief.

## 2020-06-05 NOTE — ED Provider Notes (Addendum)
[facility]    CSN: [id number] Arrival date & time: 06/05/20  1748      History   Chief Complaint Chief Complaint  Patient presents with  . Urinary Frequency    HPI Holly Frazier is a 50 y.o. female.   HPI  Right flank pain, after further discussion pain is mostly in lower lumbar region of back. She has had urine frequency x 2 days ago, although endorses increased intake of water. She also endorses sensation of lower pelvic pressure with urge to urinate.  Denies fever, nausea or vomiting.  No history of recurrent UTIs.  She is afebrile.  Denies visible hematuria.  Unrelated blood pressure is elevated x2 readings here in clinic.  Patient is not currently being treated with any blood pressure medication.  Past Medical History:  Diagnosis Date  . Heart murmur     Patient Active Problem List   Diagnosis Date Noted  . Obesity (BMI 30-39.9) 08/27/2015    Past Surgical History:  Procedure Laterality Date  . TONSILLECTOMY      OB History   No obstetric history on file.      Home Medications    Prior to Admission medications   Medication Sig Start Date End Date Taking? Authorizing Provider  cyclobenzaprine (FLEXERIL) 5 MG tablet Take 1 tablet (5 mg total) by mouth at bedtime. 06/05/20   Scot Jun, FNP  levonorgestrel (MIRENA) 20 MCG/24HR IUD 1 each by Intrauterine route once.    [provider]  oseltamivir (TAMIFLU) 75 MG capsule Take 1 capsule (75 mg total) by mouth daily. 03/18/18   Sharion Balloon, FNP  predniSONE (STERAPRED UNI-PAK 21 TAB) 10 MG (21) TBPK tablet 6 tabs for 1 day, then 5 tabs for 1 das, then 4 tabs for 1 day, then 3 tabs for 1 day, 2 tabs for 1 day, then 1 tab for 1 day 02/09/20   Orvan July, NP    Family History Family History  Problem Relation Age of Onset  . Hypertension Father   . Hyperlipidemia Father   . Deep vein thrombosis Father     Social History Social History   Tobacco Use  . Smoking status:  Former Smoker    Years: 20.00    Quit date: 02/09/2006    Years since quitting: 14.3  . Smokeless tobacco: Never Used  Substance Use Topics  . Alcohol use: Not Currently  . Drug use: Never     Allergies   Orange fruit [citrus]   Review of Systems Review of Systems Pertinent negatives listed in HPI   Physical Exam Triage Vital Signs ED Triage Vitals  Enc Vitals Group     BP 06/05/20 1819 (!) 164/107     Pulse Rate 06/05/20 1819 74     Resp 06/05/20 1819 16     Temp 06/05/20 1819 98.4 F (36.9 C)     Temp Source 06/05/20 1819 Oral     SpO2 06/05/20 1819 98 %     Weight --      Height --      Head Circumference --      Peak Flow --      Pain Score 06/05/20 1817 5     Pain Loc --      Pain Edu? --      Excl. in [place]? --    No data found.  Updated Vital Signs BP (!) 164/107 (BP Location: Right Arm)   Pulse 74   Temp 98.4  F (36.9 C) (Oral)   Resp 16   SpO2 98%   Visual Acuity Right Eye Distance:   Left Eye Distance:   Bilateral Distance:    Right Eye Near:   Left Eye Near:    Bilateral Near:     Physical Exam General appearance: alert, well developed, well nourished, cooperative and in no distress Head: Normocephalic, without obvious abnormality, atraumatic Respiratory: Respirations even and unlabored, normal respiratory rate Heart: rate and rhythm normal. No gallop or murmurs noted on exam  Abdomen: BS +, no distention, no rebound tenderness, or no mass Extremities: No gross deformities Skin: Skin color, texture, turgor normal. No rashes seen  Psych: Appropriate mood and affect.  UC Treatments / Results  Labs (all labs ordered are listed, but only abnormal results are displayed) Labs Reviewed  URINE CULTURE - Abnormal; Notable for the following components:      Result Value   Culture   (*)    Value: <10,000 COLONIES/mL INSIGNIFICANT GROWTH Performed at [facility], [address] [place], [place] [address]    All other components within  normal limits  POCT URINALYSIS DIP (MANUAL ENTRY) - Abnormal; Notable for the following components:   Color, UA yellow (*)    All other components within normal limits    EKG   Radiology No results found.  Procedures Procedures (including critical care time)  Medications Ordered in UC Medications - No data to display  Initial Impression / Assessment and Plan / UC Course  I have reviewed the triage vital signs and the nursing notes.  Pertinent labs & imaging results that were available during my care of the patient were reviewed by me and considered in my medical decision making (see chart for details).    Patient presents today for evaluation possible UTI, UA findings not consistent with that of a acute urinary tract infection and on exam patient is having similar lumbar back pain which I suspect is MSK related. Trial Cyclobenzaprine and recommended otc Naproxen for pain. Urine culture pending will await culture results before initiated treatment of UTI. BP elevated without history of hypertension.  Advised to follow-up with primary care. Final Clinical Impressions(s) / UC Diagnoses   Final diagnoses:  Urinary frequency  Elevated blood pressure reading with diagnosis of hypertension   Discharge Instructions   None    ED Prescriptions    Medication Sig Dispense Auth. Provider   cyclobenzaprine (FLEXERIL) 5 MG tablet Take 1 tablet (5 mg total) by mouth at bedtime. 30 tablet Scot Jun, FNP     PDMP not reviewed this encounter.   Scot Jun, FNP 06/10/20 1308    Scot Jun, FNP 06/10/20 1309

## 2020-06-07 LAB — URINE CULTURE
Culture: <10000 — AB
Special Requests: NORMAL

## 2020-06-27 ENCOUNTER — Encounter: Payer: Self-pay | Admitting: *Deleted

## 2020-11-05 ENCOUNTER — Telehealth: Payer: Self-pay

## 2020-11-05 ENCOUNTER — Encounter: Payer: Self-pay | Admitting: Gastroenterology

## 2020-11-05 ENCOUNTER — Ambulatory Visit (INDEPENDENT_AMBULATORY_CARE_PROVIDER_SITE_OTHER): Payer: BC Managed Care – PPO | Admitting: Gastroenterology

## 2020-11-05 ENCOUNTER — Other Ambulatory Visit: Payer: Self-pay

## 2020-11-05 DIAGNOSIS — Z1211 Encounter for screening for malignant neoplasm of colon: Secondary | ICD-10-CM

## 2020-11-05 NOTE — Telephone Encounter (Signed)
Pt requested TCS to be done in November. She is aware we will call her to schedule TCS when Dr. Ave Filter November schedule is available. Propofol ASA 3.

## 2020-11-05 NOTE — Patient Instructions (Signed)
We are arranging a colonoscopy in the near future with Dr. Abbey Chatters.  Further recommendations to follow!   It was a pleasure to see you today. I want to create trusting relationships with patients to provide genuine, compassionate, and quality care. I value your feedback. If you receive a survey regarding your visit,  I greatly appreciate you taking time to fill this out.   Annitta Needs, PhD, ANP-BC [facility]

## 2020-11-05 NOTE — Progress Notes (Signed)
Primary Care Physician:  Pcp, No Referring Physician: Dr. Earlie Server, [facility] Primary Gastroenterologist:  Dr. Abbey Chatters  Chief Complaint  Patient presents with   Consult    TCS never done prior. Mom/dad polyps removed    HPI:   Holly Frazier is a 50 y.o. female presenting today at the request of Dr. Ulla Gallo at [facility] to arrange initial screening colonoscopy. She does report history of colon polyps in both mom and dad; however, she clarifies these were hyperplastic to best of her knowledge.   She has no abdominal pain, N/V, GERD, dysphagia, unintentional weight loss, lack of appetite, overt GI bleeding, changes in bowel habits. Has no concerns today.   Past Medical History:  Diagnosis Date   Heart murmur     Past Surgical History:  Procedure Laterality Date   ABDOMINAL HYSTERECTOMY     TONSILLECTOMY      Current Outpatient Medications  Medication Sig Dispense Refill   cetirizine (ZYRTEC) 10 MG tablet Take 10 mg by mouth daily.     estradiol (ESTRACE) 2 MG tablet daily.     No current facility-administered medications for this visit.    Allergies as of 11/05/2020 - Review Complete 11/05/2020  Allergen Reaction Noted   Orange fruit [citrus]  05/05/2012    Family History  Problem Relation Age of Onset   Colon polyps Mother        hyperplastic   Hypertension Father    Hyperlipidemia Father    Deep vein thrombosis Father    Colon polyps Father        hyperplastic    Social History   Socioeconomic History   Marital status: Married    Spouse name: Not on file   Number of children: 1   Years of education: Not on file   Highest education level: Not on file  Occupational History   Occupation: [employer]  Tobacco Use   Smoking status: Former    Years: 20.00    Types: Cigarettes    Quit date: 02/09/2006    Years since quitting: 14.7   Smokeless tobacco: Never  Substance and Sexual Activity    Alcohol use: Not Currently   Drug use: Never   Sexual activity: Not on file  Other Topics Concern   Not on file  Social History Narrative   Not on file   Social Determinants of Health   Financial Resource Strain: Not on file  Food Insecurity: Not on file  Transportation Frazier: Not on file  Physical Activity: Not on file  Stress: Not on file  Social Connections: Not on file  Intimate Partner Violence: Not on file    Review of Systems: Gen: Denies any fever, chills, fatigue, weight loss, lack of appetite.  CV: Denies chest pain, heart palpitations, peripheral edema, syncope.  Resp: Denies shortness of breath at rest or with exertion. Denies wheezing or cough.  GI: see HPI GU : Denies urinary burning, urinary frequency, urinary hesitancy MS: Denies joint pain, muscle weakness, cramps, or limitation of movement.  Derm: Denies rash, itching, dry skin Psych: Denies depression, anxiety, memory loss, and confusion Heme: Denies bruising, bleeding, and enlarged lymph nodes.  Physical Exam: BP (!) 148/96   Pulse 74   Temp (!) 97.1 F (36.2 C)   Ht 5\' 7"  (1.702 m)   Wt 277 lb 6.4 oz (125.8 kg)   BMI 43.45 kg/m  General:   Alert and oriented. Pleasant and cooperative.  Well-nourished and well-developed.  Head:  Normocephalic and atraumatic. Eyes:  Without icterus, sclera clear and conjunctiva pink.  Ears:  Normal auditory acuity. Mouth:  mask in place Lungs:  Clear to auscultation bilaterally. No wheezes, rales, or rhonchi. No distress.  Heart:  S1, S2 present without murmurs appreciated.  Abdomen:  +BS, soft, non-tender and non-distended. No HSM noted. No guarding or rebound. No masses appreciated.  Rectal:  Deferred  Msk:  Symmetrical without gross deformities. Normal posture. Extremities:  Without edema. Neurologic:  Alert and  oriented x4;  grossly normal neurologically. Skin:  Intact without significant lesions or rashes. Psych:  Alert and cooperative. Normal mood and  affect.  ASSESSMENT: Holly Frazier is a 50 y.o. female presenting today with need for initial screening colonoscopy. Although she reports history of colon polyps in both parents, these were described as hyperplastic and no known adenomas or advanced polyps.  She has no concerning upper or lower GI signs/symptoms. Due to BMI greater than 40, ASA will be 3. She has no other concerning health issues.    PLAN: Proceed with colonoscopy by Dr. Abbey Chatters  in near future: the risks, benefits, and alternatives have been discussed with the patient in detail. The patient states understanding and desires to proceed.   Further recommendations to follow.  Holly Needs, PhD, ANP-BC [facility]

## 2020-11-18 ENCOUNTER — Other Ambulatory Visit: Payer: Self-pay

## 2020-11-18 MED ORDER — PEG 3350-KCL-NA BICARB-NACL 420 G PO SOLR: 4000.0000 mL | ORAL | 0 refills | Status: AC

## 2020-11-18 NOTE — Telephone Encounter (Signed)
Spoke to pt, TCS scheduled for 12/23/20 at 10:30am.

## 2020-11-18 NOTE — Telephone Encounter (Signed)
Tried to call pt to schedule TCS, LMOVM for return call. °

## 2020-11-19 NOTE — Telephone Encounter (Signed)
Pre-op appt 12/19/20. Appt letter mailed with procedure instructions.

## 2020-12-17 NOTE — Patient Instructions (Signed)
Holly Frazier  12/17/2020     @PREFPERIOPPHARMACY @   Your procedure is scheduled on  12/23/2020.   Report to [facility] at  0830 A.M.   Call this number if you have problems the morning of surgery:  [phone]   Remember:  Follow the diet and prep instructions given to you by the office.    Take these medicines the morning of surgery with A SIP OF WATER                                     zyrtec.      Do not wear jewelry, make-up or nail polish.  Do not wear lotions, powders, or perfumes, or deodorant.  Do not shave 48 hours prior to surgery.  Men may shave face and neck.  Do not bring valuables to the hospital.  [facility] is not responsible for any belongings or valuables.  Contacts, dentures or bridgework may not be worn into surgery.  Leave your suitcase in the car.  After surgery it may be brought to your room.  For patients admitted to the hospital, discharge time will be determined by your treatment team.  Patients discharged the day of surgery will not be allowed to drive home and must have someone with them for 24 hours.    Special instructions:   DO NOT smoke tobacco or vape for 24 hours before your procedure.  Please read over the following fact sheets that you were given. Anesthesia Post-op Instructions and Care and Recovery After Surgery      Colonoscopy, Adult, Care After This sheet gives you information about how to care for yourself after your procedure. Your health care provider may also give you more specific instructions. If you have problems or questions, contact your health care provider. What can I expect after the procedure? After the procedure, it is common to have: A small amount of blood in your stool for 24 hours after the procedure. Some gas. Mild cramping or bloating of your abdomen. Follow these instructions at home: Eating and drinking  Drink enough fluid to keep your urine pale yellow. Follow instructions from your  health care provider about eating or drinking restrictions. Resume your normal diet as instructed by your health care provider. Avoid heavy or fried foods that are hard to digest. Activity Rest as told by your health care provider. Avoid sitting for a long time without moving. Get up to take short walks every 1-2 hours. This is important to improve blood flow and breathing. Ask for help if you feel weak or unsteady. Return to your normal activities as told by your health care provider. Ask your health care provider what activities are safe for you. Managing cramping and bloating  Try walking around when you have cramps or feel bloated. Apply heat to your abdomen as told by your health care provider. Use the heat source that your health care provider recommends, such as a moist heat pack or a heating pad. Place a towel between your skin and the heat source. Leave the heat on for 20-30 minutes. Remove the heat if your skin turns bright red. This is especially important if you are unable to feel pain, heat, or cold. You may have a greater risk of getting burned. General instructions If you were given a sedative during the procedure, it can affect you for several hours. Do  not drive or operate machinery until your health care provider says that it is safe. For the first 24 hours after the procedure: Do not sign important documents. Do not drink alcohol. Do your regular daily activities at a slower pace than normal. Eat soft foods that are easy to digest. Take over-the-counter and prescription medicines only as told by your health care provider. Keep all follow-up visits as told by your health care provider. This is important. Contact a health care provider if: You have blood in your stool 2-3 days after the procedure. Get help right away if you have: More than a small spotting of blood in your stool. Large blood clots in your stool. Swelling of your abdomen. Nausea or vomiting. A  fever. Increasing pain in your abdomen that is not relieved with medicine. Summary After the procedure, it is common to have a small amount of blood in your stool. You may also have mild cramping and bloating of your abdomen. If you were given a sedative during the procedure, it can affect you for several hours. Do not drive or operate machinery until your health care provider says that it is safe. Get help right away if you have a lot of blood in your stool, nausea or vomiting, a fever, or increased pain in your abdomen. This information is not intended to replace advice given to you by your health care provider. Make sure you discuss any questions you have with your health care provider. Document Revised: 12/02/2018 Document Reviewed: 08/22/2018 Elsevier Patient Education  [place] After This sheet gives you information about how to care for yourself after your procedure. Your health care provider may also give you more specific instructions. If you have problems or questions, contact your health care provider. What can I expect after the procedure? After the procedure, it is common to have: Tiredness. Forgetfulness about what happened after the procedure. Impaired judgment for important decisions. Nausea or vomiting. Some difficulty with balance. Follow these instructions at home: For the time period you were told by your health care provider:   Rest as needed. Do not participate in activities where you could fall or become injured. Do not drive or use machinery. Do not drink alcohol. Do not take sleeping pills or medicines that cause drowsiness. Do not make important decisions or sign legal documents. Do not take care of children on your own. Eating and drinking Follow the diet that is recommended by your health care provider. Drink enough fluid to keep your urine pale yellow. If you vomit: Drink water, juice, or soup when you can drink  without vomiting. Make sure you have little or no nausea before eating solid foods. General instructions Have a responsible adult stay with you for the time you are told. It is important to have someone help care for you until you are awake and alert. Take over-the-counter and prescription medicines only as told by your health care provider. If you have sleep apnea, surgery and certain medicines can increase your risk for breathing problems. Follow instructions from your health care provider about wearing your sleep device: Anytime you are sleeping, including during daytime naps. While taking prescription pain medicines, sleeping medicines, or medicines that make you drowsy. Avoid smoking. Keep all follow-up visits as told by your health care provider. This is important. Contact a health care provider if: You keep feeling nauseous or you keep vomiting. You feel light-headed. You are still sleepy or having trouble with balance  after 24 hours. You develop a rash. You have a fever. You have redness or swelling around the IV site. Get help right away if: You have trouble breathing. You have new-onset confusion at home. Summary For several hours after your procedure, you may feel tired. You may also be forgetful and have poor judgment. Have a responsible adult stay with you for the time you are told. It is important to have someone help care for you until you are awake and alert. Rest as told. Do not drive or operate machinery. Do not drink alcohol or take sleeping pills. Get help right away if you have trouble breathing, or if you suddenly become confused. This information is not intended to replace advice given to you by your health care provider. Make sure you discuss any questions you have with your health care provider. Document Revised: 10/12/2019 Document Reviewed: 12/29/2018 Elsevier Patient Education  2022 [employer].

## 2020-12-19 ENCOUNTER — Encounter (HOSPITAL_COMMUNITY)
Admission: RE | Admit: 2020-12-19 | Discharge: 2020-12-19 | Disposition: A | Discharge status: Home / Self Care | Payer: BC Managed Care – PPO | Source: Ambulatory Visit | Attending: Internal Medicine | Admitting: Internal Medicine

## 2020-12-19 ENCOUNTER — Other Ambulatory Visit (HOSPITAL_COMMUNITY): Payer: BC Managed Care – PPO

## 2020-12-19 ENCOUNTER — Other Ambulatory Visit: Payer: Self-pay

## 2020-12-23 ENCOUNTER — Ambulatory Visit (HOSPITAL_COMMUNITY): Payer: BC Managed Care – PPO | Admitting: Anesthesiology

## 2020-12-23 ENCOUNTER — Encounter (HOSPITAL_COMMUNITY): Payer: Self-pay

## 2020-12-23 ENCOUNTER — Encounter (HOSPITAL_COMMUNITY)
Admission: RE | Disposition: A | Discharge status: Home / Self Care | Payer: Self-pay | Source: Ambulatory Visit | Attending: Internal Medicine

## 2020-12-23 ENCOUNTER — Ambulatory Visit (HOSPITAL_COMMUNITY)
Admission: RE | Admit: 2020-12-23 | Discharge: 2020-12-23 | Disposition: A | Discharge status: Home / Self Care | Payer: BC Managed Care – PPO | Source: Ambulatory Visit | Attending: Internal Medicine | Admitting: Internal Medicine

## 2020-12-23 DIAGNOSIS — Z87891 Personal history of nicotine dependence: Secondary | ICD-10-CM | POA: Diagnosis not present

## 2020-12-23 DIAGNOSIS — Z6841 Body Mass Index (BMI) 40.0 and over, adult: Secondary | ICD-10-CM | POA: Insufficient documentation

## 2020-12-23 DIAGNOSIS — K635 Polyp of colon: Secondary | ICD-10-CM | POA: Diagnosis not present

## 2020-12-23 DIAGNOSIS — D123 Benign neoplasm of transverse colon: Secondary | ICD-10-CM | POA: Diagnosis not present

## 2020-12-23 DIAGNOSIS — K648 Other hemorrhoids: Secondary | ICD-10-CM | POA: Diagnosis not present

## 2020-12-23 DIAGNOSIS — Z1211 Encounter for screening for malignant neoplasm of colon: Secondary | ICD-10-CM

## 2020-12-23 HISTORY — PX: POLYPECTOMY: SHX5525

## 2020-12-23 HISTORY — PX: COLONOSCOPY WITH PROPOFOL: SHX5780

## 2020-12-23 SURGERY — COLONOSCOPY WITH PROPOFOL
Anesthesia: General

## 2020-12-23 MED ORDER — LACTATED RINGERS IV SOLN
INTRAVENOUS | Status: DC
  Administered 2020-12-23: 1000 mL via INTRAVENOUS

## 2020-12-23 MED ORDER — PROPOFOL 10 MG/ML IV BOLUS
INTRAVENOUS | Status: DC | PRN
  Administered 2020-12-23: 80 mg via INTRAVENOUS
  Administered 2020-12-23: 120 mg via INTRAVENOUS
  Administered 2020-12-23: 100 mg via INTRAVENOUS

## 2020-12-23 MED ORDER — LIDOCAINE HCL (CARDIAC) PF 100 MG/5ML IV SOSY
PREFILLED_SYRINGE | INTRAVENOUS | Status: DC | PRN
  Administered 2020-12-23: 50 mg via INTRAVENOUS

## 2020-12-23 NOTE — Anesthesia Preprocedure Evaluation (Signed)
Anesthesia Evaluation  Patient identified by MRN, date of birth, ID band Patient awake    Reviewed: Allergy & Precautions, H&P , NPO status , Patient's Chart, lab work & pertinent test results, reviewed documented beta blocker date and time   Airway Mallampati: II  TM Distance: >3 FB Neck ROM: full    Dental no notable dental hx.    Pulmonary neg pulmonary ROS, former smoker,    Pulmonary exam normal breath sounds clear to auscultation       Cardiovascular Exercise Tolerance: Good negative cardio ROS   Rhythm:regular Rate:Normal     Neuro/Psych negative neurological ROS  negative psych ROS   GI/Hepatic negative GI ROS, Neg liver ROS,   Endo/Other  Morbid obesity  Renal/GU negative Renal ROS  negative genitourinary   Musculoskeletal   Abdominal   Peds  Hematology negative hematology ROS (+)   Anesthesia Other Findings   Reproductive/Obstetrics negative OB ROS                             Anesthesia Physical Anesthesia Plan  ASA: 3  Anesthesia Plan: General   Post-op Pain Management:    Induction:   PONV Risk Score and Plan:   Airway Management Planned:   Additional Equipment:   Intra-op Plan:   Post-operative Plan:   Informed Consent: I have reviewed the patients History and Physical, chart, labs and discussed the procedure including the risks, benefits and alternatives for the proposed anesthesia with the patient or authorized representative who has indicated his/her understanding and acceptance.     Dental Advisory Given  Plan Discussed with: CRNA  Anesthesia Plan Comments:         Anesthesia Quick Evaluation

## 2020-12-23 NOTE — Progress Notes (Signed)
Holly Frazier had a procedure at [facility] on 12/23/20 and cannot return to work until noon on 12/24/20

## 2020-12-23 NOTE — Transfer of Care (Signed)
Immediate Anesthesia Transfer of Care Note  Patient: Holly Frazier  Procedure(s) Performed: COLONOSCOPY WITH PROPOFOL POLYPECTOMY  Patient Location: Short Stay  Anesthesia Type:General  Level of Consciousness: awake  Airway & Oxygen Therapy: Patient Spontanous Breathing  Post-op Assessment: Report given to RN and Post -op Vital signs reviewed and stable  Post vital signs: Reviewed and stable  Last Vitals:  Vitals Value Taken Time  BP    Temp    Pulse 77 12/23/20 1131  Resp 15 12/23/20 1131  SpO2 100 % 12/23/20 1131    Last Pain:  Vitals:   12/23/20 1131  TempSrc: Oral  PainSc: 0-No pain      Patients Stated Pain Goal: 7 (09/23/46 1856)  Complications: No notable events documented.

## 2020-12-23 NOTE — Anesthesia Postprocedure Evaluation (Signed)
Anesthesia Post Note  Patient: Holly Frazier  Procedure(s) Performed: COLONOSCOPY WITH PROPOFOL POLYPECTOMY  Patient location during evaluation: Phase II Anesthesia Type: General Level of consciousness: awake Pain management: pain level controlled Vital Signs Assessment: post-procedure vital signs reviewed and stable Respiratory status: spontaneous breathing and respiratory function stable Cardiovascular status: blood pressure returned to baseline and stable Postop Assessment: no headache and no apparent nausea or vomiting Anesthetic complications: no Comments: Late entry   No notable events documented.   Last Vitals:  Vitals:   12/23/20 1031 12/23/20 1131  BP: (!) 153/88   Pulse:  77  Resp: (!) 22 15  Temp: 36.6 C   SpO2: 100% 100%    Last Pain:  Vitals:   12/23/20 1131  TempSrc: Oral  PainSc: 0-No pain                 Louann Sjogren

## 2020-12-23 NOTE — Op Note (Signed)
[facility] [place] Patient Name: Holly Frazier Procedure Date: 12/23/2020 11:00 AM MRN: [id number] Date of Birth: 1970-11-05 Attending MD: Elon Alas. Abbey Chatters DO CSN: [id number] Age: 50 Admit Type: Outpatient Procedure:                Colonoscopy Indications:              Screening for colorectal malignant neoplasm Providers:                Elon Alas. Abbey Chatters, DO, Lambert Mody, Raphael Gibney, Technician Referring MD:              Medicines:                See the Anesthesia note for documentation of the                            administered medications Complications:            No immediate complications. Estimated Blood Loss:     Estimated blood loss was minimal. Procedure:                Pre-Anesthesia Assessment:                           - The anesthesia plan was to use monitored                            anesthesia care (MAC).                           After obtaining informed consent, the colonoscope                            was passed under direct vision. Throughout the                            procedure, the patient's blood pressure, pulse, and                            oxygen saturations were monitored continuously. The                            [id number]-HQ190L ([id number]) scope was introduced through                            the anus and advanced to the the cecum, identified                            by appendiceal orifice and ileocecal valve. The                            colonoscopy was performed without difficulty. The                            patient tolerated the procedure well. The quality  of the bowel preparation was evaluated using the                            BBPS [facility] Bowel Preparation Scale) with scores                            of: Right Colon = 3, Transverse Colon = 3 and Left                            Colon = 3 (entire mucosa seen well with no residual                            staining,  small fragments of stool or opaque                            liquid). The total BBPS score equals 9. Scope In: 11:16:26 AM Scope Out: 11:27:09 AM Scope Withdrawal Time: 0 hours 8 minutes 26 seconds  Total Procedure Duration: 0 hours 10 minutes 43 seconds  Findings:      The perianal and digital rectal examinations were normal.      Non-bleeding internal hemorrhoids were found during endoscopy.      A 4 mm polyp was found in the transverse colon. The polyp was sessile.       The polyp was removed with a cold snare. Resection and retrieval were       complete.      The exam was otherwise without abnormality on direct and retroflexion       views. Impression:               - Non-bleeding internal hemorrhoids.                           - One 4 mm polyp in the transverse colon, removed                            with a cold snare. Resected and retrieved.                           - The examination was otherwise normal on direct                            and retroflexion views. Moderate Sedation:      Per Anesthesia Care Recommendation:           - Patient has a contact number available for                            emergencies. The signs and symptoms of potential                            delayed complications were discussed with the                            patient. Return to normal activities tomorrow.  Written discharge instructions were provided to the                            patient.                           - Resume previous diet.                           - Continue present medications.                           - Await pathology results.                           - Repeat colonoscopy in 5-10 years for surveillance.                           - Return to GI clinic PRN. Procedure Code(s):        --- Professional ---                           [phone], Colonoscopy, flexible; with removal of                            tumor(s), polyp(s), or other  lesion(s) by snare                            technique Diagnosis Code(s):        --- Professional ---                           Z12.11, Encounter for screening for malignant                            neoplasm of colon                           K63.5, Polyp of colon                           K64.8, Other hemorrhoids CPT copyright 2019 American Medical Association. All rights reserved. The codes documented in this report are preliminary and upon coder review may  be revised to meet current compliance requirements. Elon Alas. Abbey Chatters, DO [place] Abbey Chatters, DO 12/23/2020 11:33:00 AM This report has been signed electronically. Number of Addenda: 0

## 2020-12-23 NOTE — Anesthesia Procedure Notes (Signed)
Date/Time: 12/23/2020 11:17 AM Performed by: Orlie Dakin, CRNA Pre-anesthesia Checklist: Patient identified, Emergency Drugs available, Suction available and Patient being monitored Patient Re-evaluated:Patient Re-evaluated prior to induction Oxygen Delivery Method: Nasal cannula Induction Type: IV induction Placement Confirmation: positive ETCO2

## 2020-12-23 NOTE — H&P (Signed)
Primary Care Physician:  Pcp, No Primary Gastroenterologist:  Dr. Abbey Chatters  Pre-Procedure History & Physical: HPI:  Holly Frazier is a 50 y.o. female is here for a colonoscopy for colon cancer screening purposes.  Patient denies any family history of colorectal cancer.  No melena or hematochezia.  No abdominal pain or unintentional weight loss.  No change in bowel habits.  Overall feels well from a GI standpoint.  Past Medical History:  Diagnosis Date   Heart murmur     Past Surgical History:  Procedure Laterality Date   ABDOMINAL HYSTERECTOMY     TONSILLECTOMY      Prior to Admission medications   Medication Sig Start Date End Date Taking? Authorizing Provider  alum hydroxide-mag trisilicate (GAVISCON) 16-10 MG CHEW chewable tablet Chew 2 tablets by mouth daily as needed for indigestion or heartburn.   Yes [provider]  cetirizine (ZYRTEC) 10 MG tablet Take 10 mg by mouth daily.   Yes [provider]  estradiol (ESTRACE) 2 MG tablet Take 2 mg by mouth daily.   Yes [provider]  ibuprofen (ADVIL) 200 MG tablet Take 600 mg by mouth every 6 (six) hours as needed for moderate pain or headache.   Yes [provider]  oseltamivir (TAMIFLU) 75 MG capsule Take 75 mg by mouth daily. 12/16/20  Yes [provider]  polyethylene glycol-electrolytes (TRILYTE) 420 g solution Take 4,000 mLs by mouth as directed. 11/18/20  Yes Eloise Harman, DO    Allergies as of 11/19/2020 - Review Complete 11/05/2020  Allergen Reaction Noted   Orange fruit [citrus]  05/05/2012    Family History  Problem Relation Age of Onset   Colon polyps Mother        hyperplastic   Hypertension Father    Hyperlipidemia Father    Deep vein thrombosis Father    Colon polyps Father        hyperplastic    Social History   Socioeconomic History   Marital status: Married    Spouse name: Not on file   Number of children: 1   Years of education: Not on file    Highest education level: Not on file  Occupational History   Occupation: [employer]  Tobacco Use   Smoking status: Former    Years: 20.00    Types: Cigarettes    Quit date: 02/09/2006    Years since quitting: 14.8   Smokeless tobacco: Never  Substance and Sexual Activity   Alcohol use: Not Currently   Drug use: Never   Sexual activity: Not on file  Other Topics Concern   Not on file  Social History Narrative   Not on file   Social Determinants of Health   Financial Resource Strain: Not on file  Food Insecurity: Not on file  Transportation Needs: Not on file  Physical Activity: Not on file  Stress: Not on file  Social Connections: Not on file  Intimate Partner Violence: Not on file    Review of Systems: See HPI, otherwise negative ROS  Physical Exam: Vital signs in last 24 hours: Temp:  [97.9 F (36.6 C)] 97.9 F (36.6 C) (11/14 1031) Resp:  [22] 22 (11/14 1031) BP: (153)/(88) 153/88 (11/14 1031) SpO2:  [100 %] 100 % (11/14 1031)   General:   Alert,  Well-developed, well-nourished, pleasant and cooperative in NAD Head:  Normocephalic and atraumatic. Eyes:  Sclera clear, no icterus.   Conjunctiva pink. Ears:  Normal auditory acuity. Nose:  No deformity,  discharge,  or lesions. Mouth:  No deformity or lesions, dentition normal. Neck:  Supple; no masses or thyromegaly. Lungs:  Clear throughout to auscultation.   No wheezes, crackles, or rhonchi. No acute distress. Heart:  Regular rate and rhythm; no murmurs, clicks, rubs,  or gallops. Abdomen:  Soft, nontender and nondistended. No masses, hepatosplenomegaly or hernias noted. Normal bowel sounds, without guarding, and without rebound.   Msk:  Symmetrical without gross deformities. Normal posture. Extremities:  Without clubbing or edema. Neurologic:  Alert and  oriented x4;  grossly normal neurologically. Skin:  Intact without significant lesions or rashes. Cervical Nodes:  No significant cervical  adenopathy. Psych:  Alert and cooperative. Normal mood and affect.  Impression/Plan: Holly Frazier is here for a colonoscopy to be performed for colon cancer screening purposes.  The risks of the procedure including infection, bleed, or perforation as well as benefits, limitations, alternatives and imponderables have been reviewed with the patient. Questions have been answered. All parties agreeable.

## 2020-12-23 NOTE — Discharge Instructions (Addendum)
  Colonoscopy Discharge Instructions  Read the instructions outlined below and refer to this sheet in the next few weeks. These discharge instructions provide you with general information on caring for yourself after you leave the hospital. Your doctor may also give you specific instructions. While your treatment has been planned according to the most current medical practices available, unavoidable complications occasionally occur.   ACTIVITY You may resume your regular activity, but move at a slower pace for the next 24 hours.  Take frequent rest periods for the next 24 hours.  Walking will help get rid of the air and reduce the bloated feeling in your belly (abdomen).  No driving for 24 hours (because of the medicine (anesthesia) used during the test).   Do not sign any important legal documents or operate any machinery for 24 hours (because of the anesthesia used during the test).  NUTRITION Drink plenty of fluids.  You may resume your normal diet as instructed by your doctor.  Begin with a light meal and progress to your normal diet. Heavy or fried foods are harder to digest and may make you feel sick to your stomach (nauseated).  Avoid alcoholic beverages for 24 hours or as instructed.  MEDICATIONS You may resume your normal medications unless your doctor tells you otherwise.  WHAT YOU CAN EXPECT TODAY Some feelings of bloating in the abdomen.  Passage of more gas than usual.  Spotting of blood in your stool or on the toilet paper.  IF YOU HAD POLYPS REMOVED DURING THE COLONOSCOPY: No aspirin products for 7 days or as instructed.  No alcohol for 7 days or as instructed.  Eat a soft diet for the next 24 hours.  FINDING OUT THE RESULTS OF YOUR TEST Not all test results are available during your visit. If your test results are not back during the visit, make an appointment with your caregiver to find out the results. Do not assume everything is normal if you have not heard from your  caregiver or the medical facility. It is important for you to follow up on all of your test results.  SEEK IMMEDIATE MEDICAL ATTENTION IF: You have more than a spotting of blood in your stool.  Your belly is swollen (abdominal distention).  You are nauseated or vomiting.  You have a temperature over 101.  You have abdominal pain or discomfort that is severe or gets worse throughout the day.   Your colonoscopy revealed 1 polyp(s) which I removed successfully. Await pathology results, my office will contact you. I recommend repeating colonoscopy in 5-10 years for surveillance purposes depending on pathology results. Otherwise follow up with GI as needed.    I hope you have a great rest of your week!  Charles K. Carver, D.O. Gastroenterology and Hepatology [facility]  

## 2020-12-27 LAB — SURGICAL PATHOLOGY

## 2020-12-30 ENCOUNTER — Encounter (HOSPITAL_COMMUNITY): Payer: Self-pay | Admitting: Internal Medicine

## 2021-03-17 ENCOUNTER — Other Ambulatory Visit: Payer: Self-pay

## 2021-03-17 ENCOUNTER — Ambulatory Visit
Admission: EM | Admit: 2021-03-17 | Discharge: 2021-03-17 | Disposition: A | Discharge status: Home / Self Care | Payer: BC Managed Care – PPO | Attending: Family Medicine | Admitting: Family Medicine

## 2021-03-17 DIAGNOSIS — J069 Acute upper respiratory infection, unspecified: Secondary | ICD-10-CM

## 2021-03-17 MED ORDER — PREDNISONE 20 MG PO TABS: 40.0000 mg | ORAL_TABLET | Freq: Every day | ORAL | 0 refills | Status: DC

## 2021-03-17 MED ORDER — PROMETHAZINE-DM 6.25-15 MG/5ML PO SYRP: 5.0000 mL | ORAL_SOLUTION | Freq: Four times a day (QID) | ORAL | 0 refills | Status: DC | PRN

## 2021-03-17 NOTE — ED Triage Notes (Signed)
Pt reports cough, nasal congestion and chest congestion x 3 days. Reports son was diagnosed with upper respiratory infection 4 days ago.   Pt reports negative COVID (home test) 2 days ago.

## 2021-03-17 NOTE — ED Provider Notes (Signed)
[facility]    CSN: [id number] Arrival date & time: 03/17/21  0849      History   Chief Complaint Chief Complaint  Patient presents with   Nasal Congestion    HPI Holly Frazier is a 51 y.o. female.   Presenting today with 3-day history of nasal congestion, sinus pressure, headache, cough, sore throat.  Denies fever, chills, chest pain, shortness of breath, abdominal pain, nausea vomiting or diarrhea.  Multiple sick contacts at home with similar symptoms.  Has been taking her typical antihistamine regimen and a cough syrup with minimal relief.  Home COVID test was negative.   Past Medical History:  Diagnosis Date   Heart murmur     Patient Active Problem List   Diagnosis Date Noted   Encounter for screening colonoscopy 11/05/2020   Obesity (BMI 30-39.9) 08/27/2015    Past Surgical History:  Procedure Laterality Date   ABDOMINAL HYSTERECTOMY     COLONOSCOPY WITH PROPOFOL N/A 12/23/2020   Procedure: COLONOSCOPY WITH PROPOFOL;  Surgeon: Eloise Harman, DO;  Location: [facility];  Service: Endoscopy;  Laterality: N/A;  10:30am   POLYPECTOMY  12/23/2020   Procedure: POLYPECTOMY;  Surgeon: Eloise Harman, DO;  Location: [facility];  Service: Endoscopy;;   TONSILLECTOMY      OB History   No obstetric history on file.      Home Medications    Prior to Admission medications   Medication Sig Start Date End Date Taking? Authorizing Provider  predniSONE (DELTASONE) 20 MG tablet Take 2 tablets (40 mg total) by mouth daily with breakfast. 03/17/21  Yes Volney American, PA-C  promethazine-dextromethorphan (PROMETHAZINE-DM) 6.25-15 MG/5ML syrup Take 5 mLs by mouth 4 (four) times daily as needed. 03/17/21  Yes Volney American, PA-C  alum hydroxide-mag trisilicate (GAVISCON) 09-98 MG CHEW chewable tablet Chew 2 tablets by mouth daily as needed for indigestion or heartburn.    [provider]  cetirizine (ZYRTEC) 10 MG tablet Take 10 mg  by mouth daily.    [provider]  estradiol (ESTRACE) 2 MG tablet Take 2 mg by mouth daily.    [provider]  ibuprofen (ADVIL) 200 MG tablet Take 600 mg by mouth every 6 (six) hours as needed for moderate pain or headache.    [provider]  oseltamivir (TAMIFLU) 75 MG capsule Take 75 mg by mouth daily. 12/16/20   [provider]  polyethylene glycol-electrolytes (TRILYTE) 420 g solution Take 4,000 mLs by mouth as directed. 11/18/20   Eloise Harman, DO    Family History Family History  Problem Relation Age of Onset   Colon polyps Mother        hyperplastic   Hypertension Father    Hyperlipidemia Father    Deep vein thrombosis Father    Colon polyps Father        hyperplastic    Social History Social History   Tobacco Use   Smoking status: Former    Years: 20.00    Types: Cigarettes    Quit date: 02/09/2006    Years since quitting: 15.1   Smokeless tobacco: Never  Substance Use Topics   Alcohol use: Not Currently   Drug use: Never     Allergies   Orange fruit [citrus]   Review of Systems Review of Systems Per HPI  Physical Exam Triage Vital Signs ED Triage Vitals  Enc Vitals Group     BP 03/17/21 1017 (!) 165/79  Pulse Rate 03/17/21 1017 89     Resp 03/17/21 1017 18     Temp 03/17/21 1017 98.4 F (36.9 C)     Temp Source 03/17/21 1017 Oral     SpO2 03/17/21 1017 98 %     Weight --      Height --      Head Circumference --      Peak Flow --      Pain Score 03/17/21 1016 0     Pain Loc --      Pain Edu? --      Excl. in [place]? --    No data found.  Updated Vital Signs BP (!) 165/79 (BP Location: Right Arm)    Pulse 89    Temp 98.4 F (36.9 C) (Oral)    Resp 18    SpO2 98%   Visual Acuity Right Eye Distance:   Left Eye Distance:   Bilateral Distance:    Right Eye Near:   Left Eye Near:    Bilateral Near:     Physical Exam Vitals and nursing note reviewed.  Constitutional:      Appearance: Normal  appearance.  HENT:     Head: Atraumatic.     Right Ear: Tympanic membrane and external ear normal.     Left Ear: Tympanic membrane and external ear normal.     Nose: Rhinorrhea present.     Mouth/Throat:     Mouth: Mucous membranes are moist.     Pharynx: Posterior oropharyngeal erythema present.  Eyes:     Extraocular Movements: Extraocular movements intact.     Conjunctiva/sclera: Conjunctivae normal.  Cardiovascular:     Rate and Rhythm: Normal rate and regular rhythm.     Heart sounds: Normal heart sounds.  Pulmonary:     Effort: Pulmonary effort is normal.     Breath sounds: Normal breath sounds. No wheezing or rales.  Musculoskeletal:        General: Normal range of motion.     Cervical back: Normal range of motion and neck supple.  Skin:    General: Skin is warm and dry.  Neurological:     Mental Status: She is alert and oriented to person, place, and time.  Psychiatric:        Mood and Affect: Mood normal.        Thought Content: Thought content normal.     UC Treatments / Results  Labs (all labs ordered are listed, but only abnormal results are displayed) Labs Reviewed - No data to display  EKG   Radiology No results found.  Procedures Procedures (including critical care time)  Medications Ordered in UC Medications - No data to display  Initial Impression / Assessment and Plan / UC Course  I have reviewed the triage vital signs and the nursing notes.  Pertinent labs & imaging results that were available during my care of the patient were reviewed by me and considered in my medical decision making (see chart for details).     Suspect viral upper respiratory infection, treat with Phenergan DM, prednisone burst given significance of symptoms and continue over-the-counter supportive medications, home care.  Declines viral testing today.  Return for acutely worsening symptoms.  Final Clinical Impressions(s) / UC Diagnoses   Final diagnoses:  Viral URI  with cough   Discharge Instructions   None    ED Prescriptions     Medication Sig Dispense Auth. Provider   promethazine-dextromethorphan (PROMETHAZINE-DM) 6.25-15 MG/5ML syrup Take 5 mLs  by mouth 4 (four) times daily as needed. 100 mL Volney American, PA-C   predniSONE (DELTASONE) 20 MG tablet Take 2 tablets (40 mg total) by mouth daily with breakfast. 10 tablet Volney American, [place]      PDMP not reviewed this encounter.   Volney American, [place] 03/17/21 1036

## 2021-08-23 IMAGING — DX DG SHOULDER 2+V*L*
3 series · 3 of 3 positions shown · non-contrast
Comparison: None.

CLINICAL DATA: Status post fall 1 month ago with left shoulder
pain.

EXAM:
LEFT SHOULDER - 2+ VIEW

[shoulder internal rotation ap]
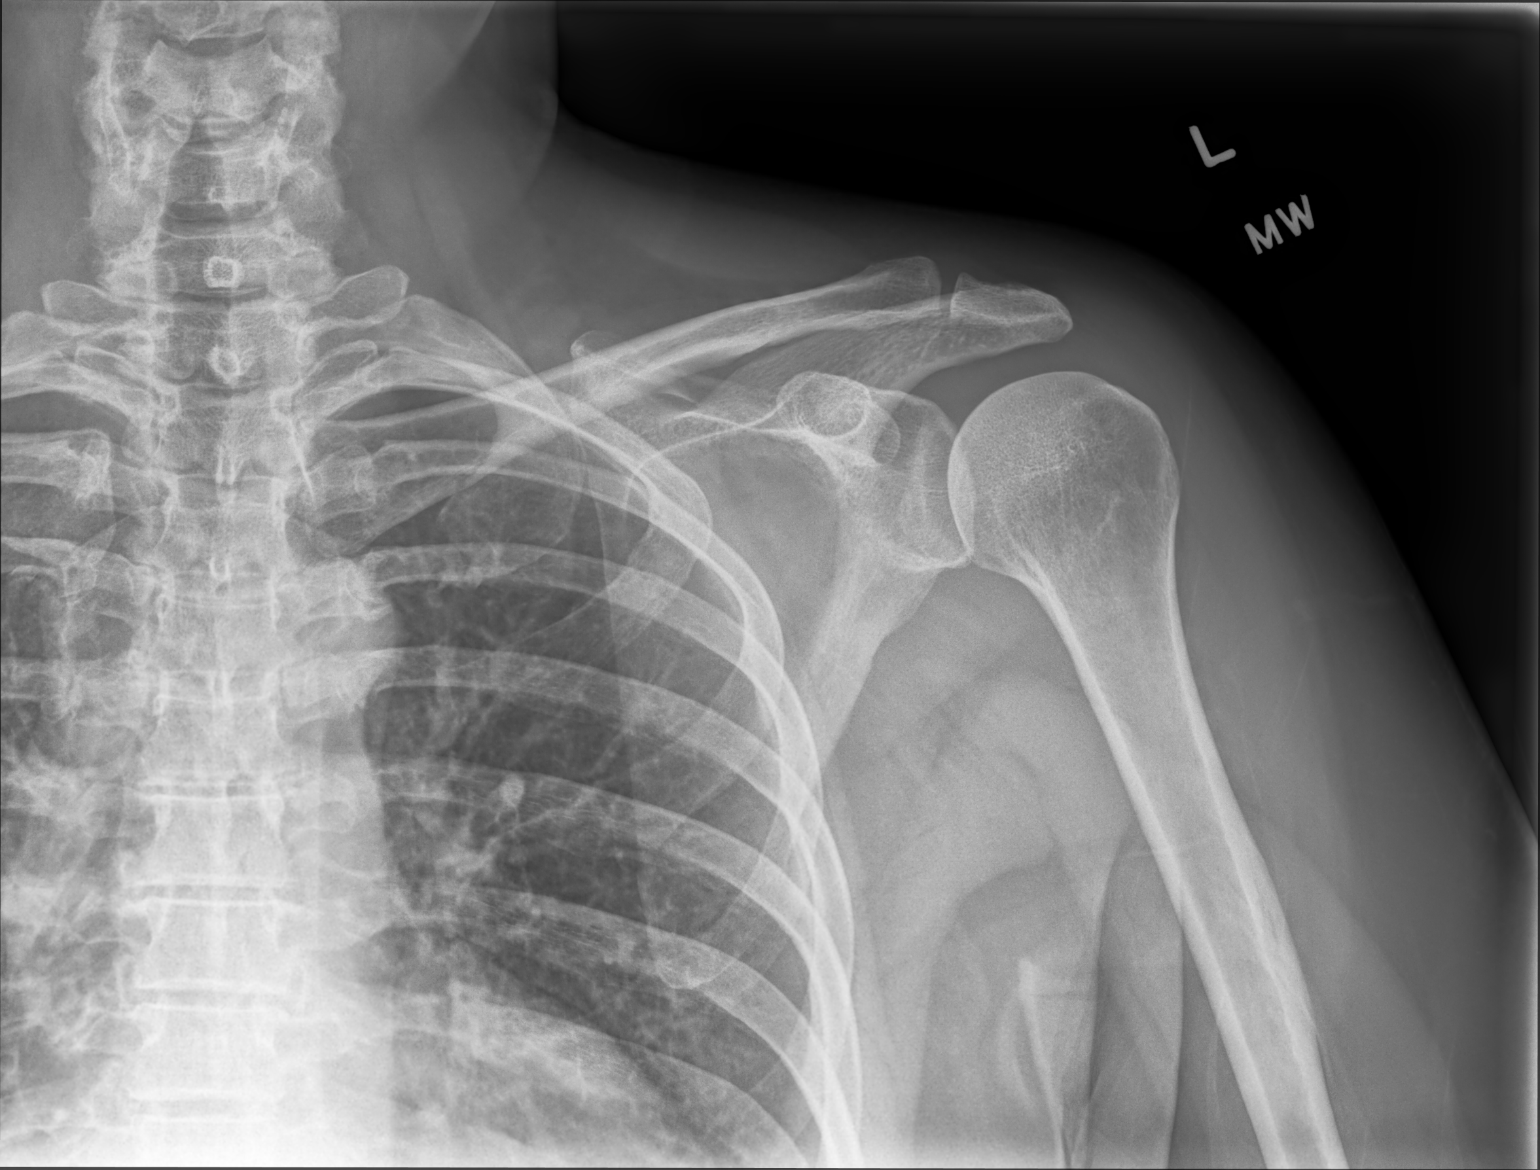

[shoulder external rotation ap]
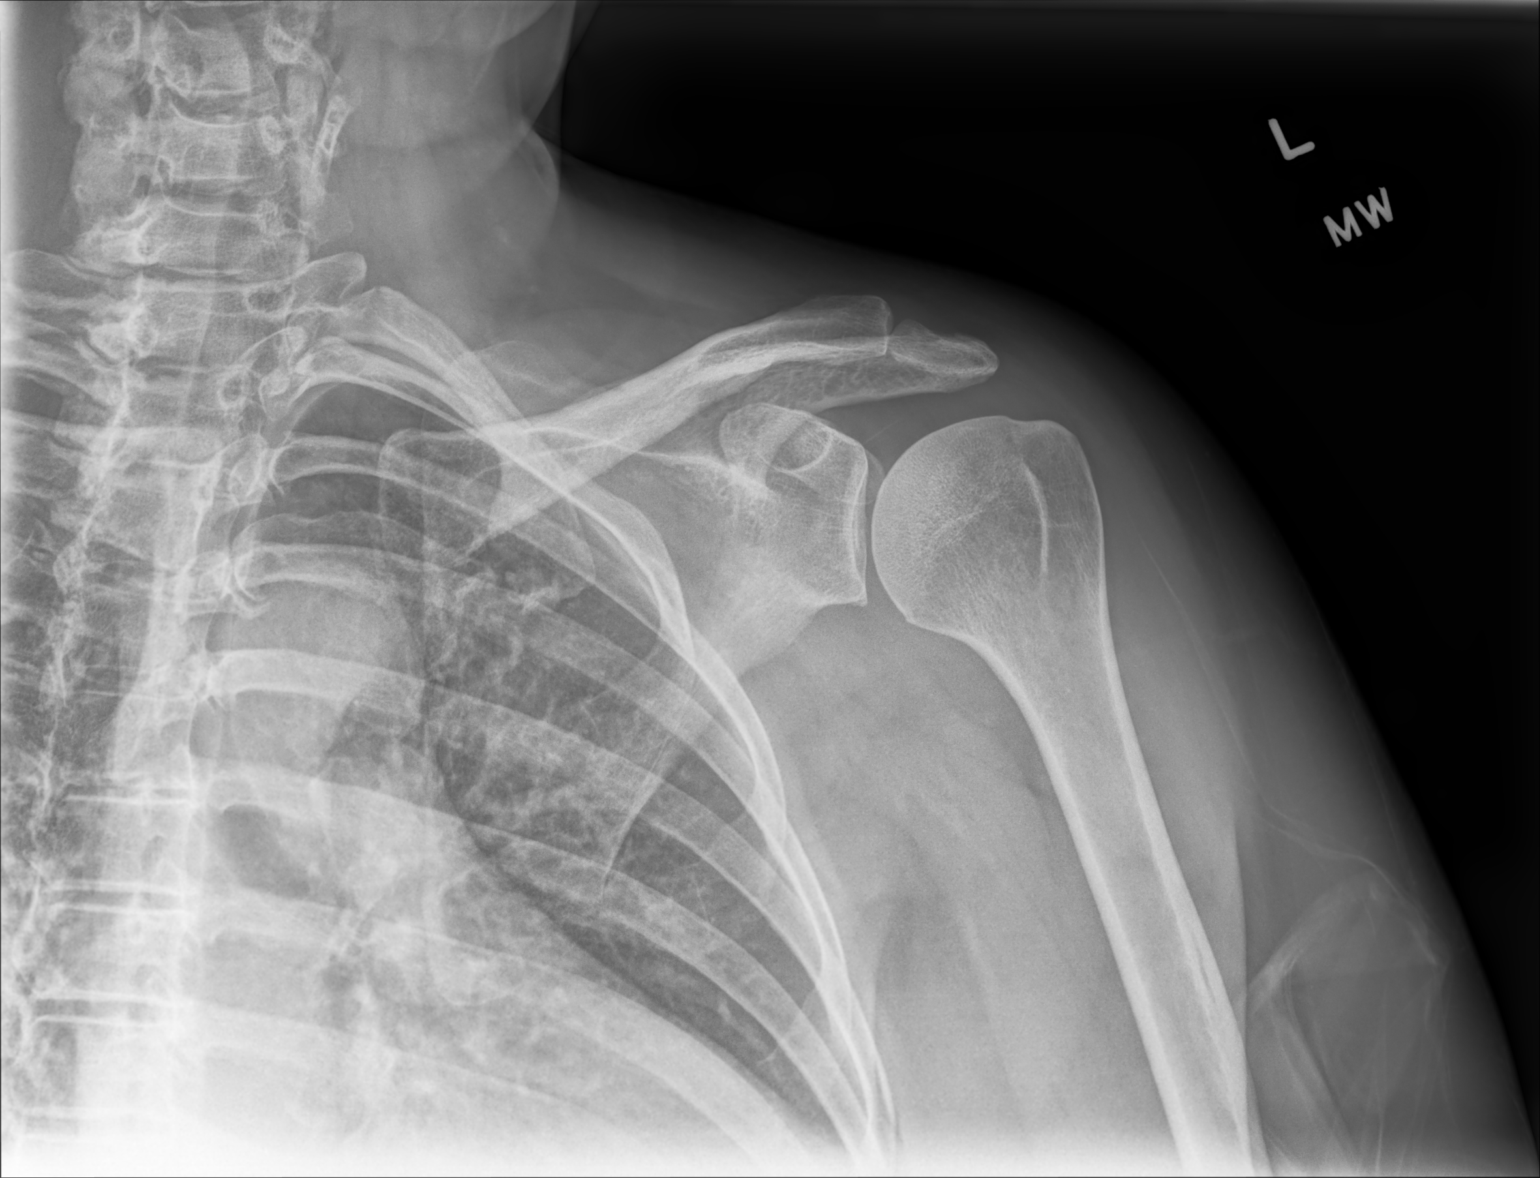

[shoulder (y view) lat]
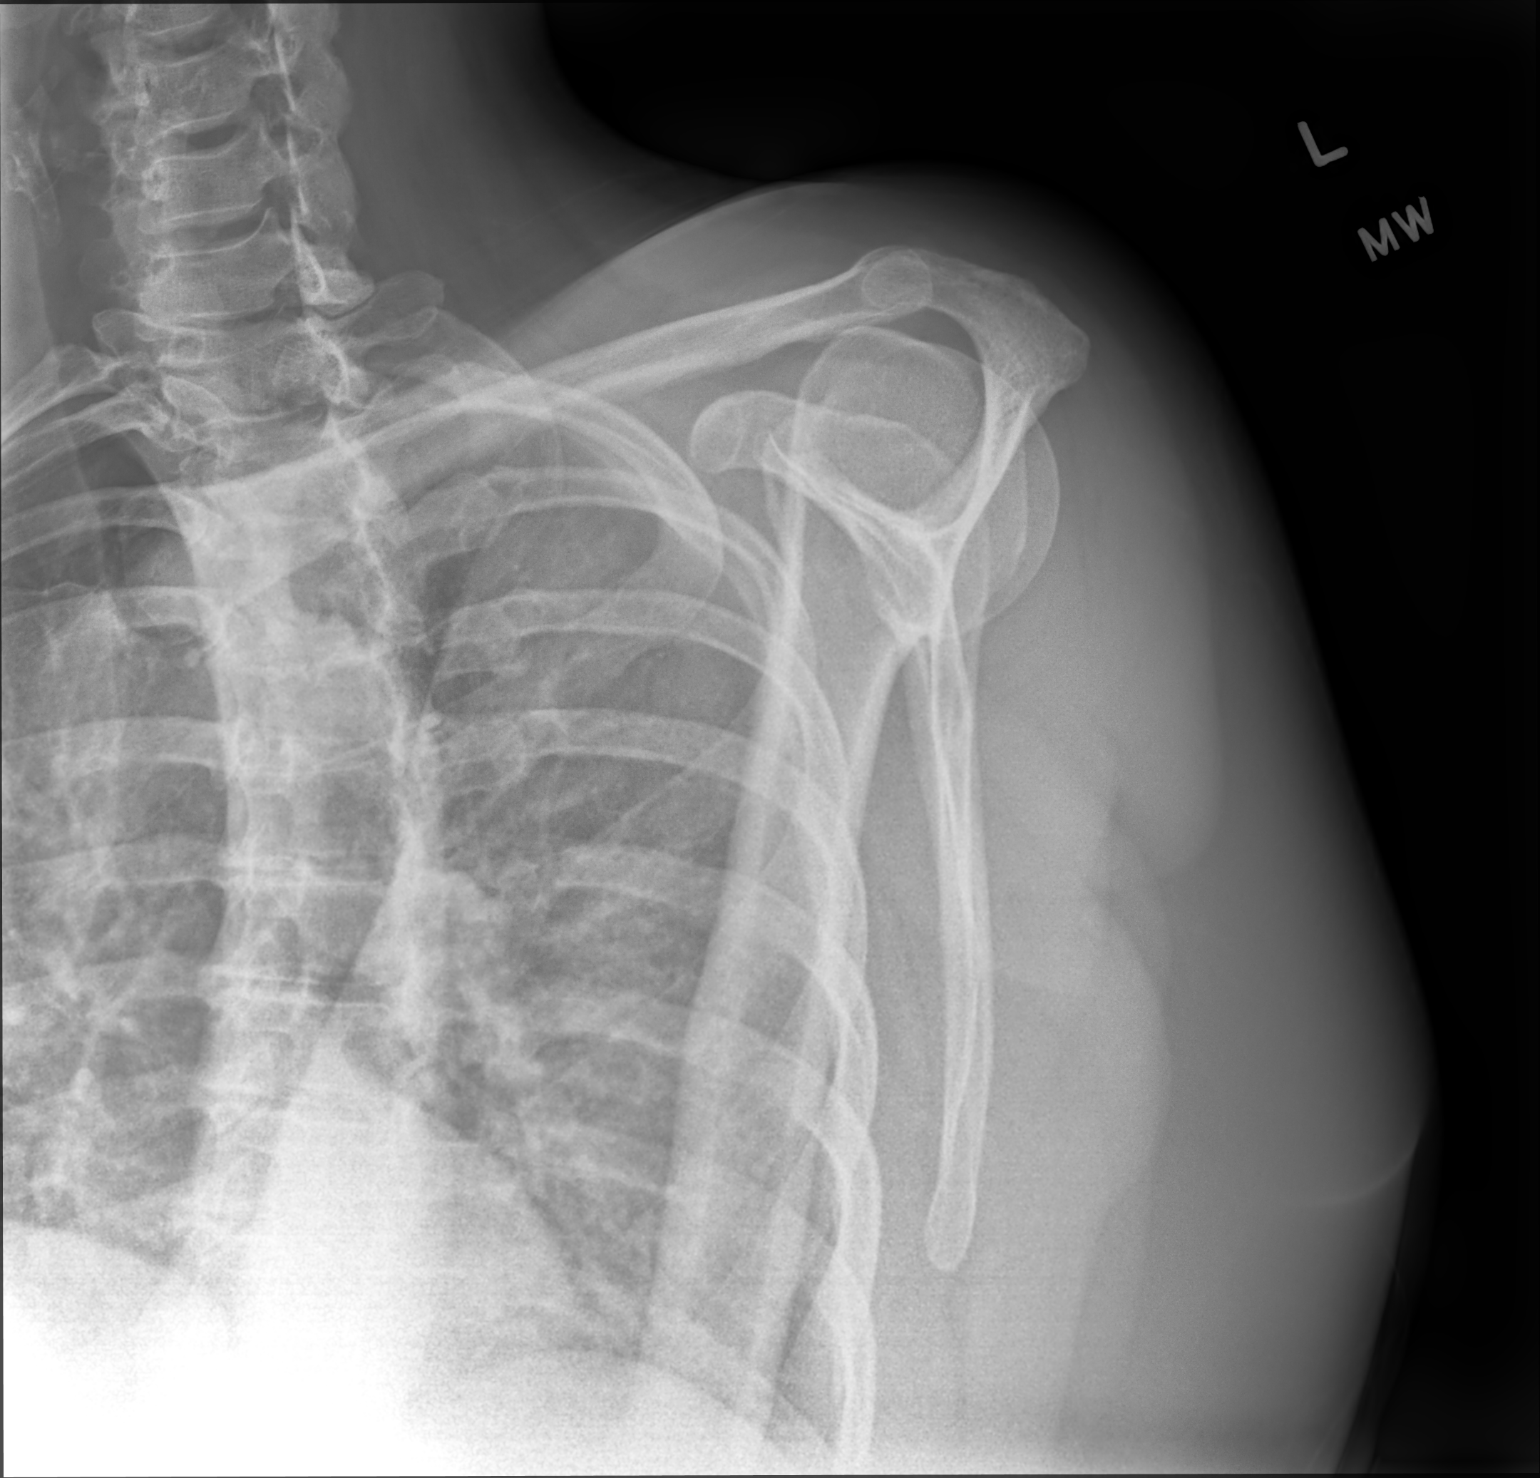

[3 of 3 positions shown; findings below may reference images not displayed]

FINDINGS: There is no evidence of fracture or dislocation. There is no
evidence of arthropathy or other focal bone abnormality. Soft
tissues are unremarkable.
IMPRESSION: Negative.

## 2021-09-17 ENCOUNTER — Ambulatory Visit (INDEPENDENT_AMBULATORY_CARE_PROVIDER_SITE_OTHER): Payer: BC Managed Care – PPO

## 2021-09-17 ENCOUNTER — Ambulatory Visit
Admission: RE | Admit: 2021-09-17 | Discharge: 2021-09-17 | Disposition: A | Discharge status: Home / Self Care | Payer: BC Managed Care – PPO | Source: Ambulatory Visit | Attending: Nurse Practitioner | Admitting: Nurse Practitioner

## 2021-09-17 ENCOUNTER — Other Ambulatory Visit: Payer: Self-pay

## 2021-09-17 VITALS — BP 175/98 | HR 86 | Temp 98.1°F | Resp 20

## 2021-09-17 DIAGNOSIS — M25571 Pain in right ankle and joints of right foot: Secondary | ICD-10-CM | POA: Diagnosis not present

## 2021-09-17 NOTE — Discharge Instructions (Signed)
-   Ankle x-ray today does not show any broken bones; there is likely a sprain around your ankle -Please wear the compression wrap, keep your foot elevated, rest your foot, apply ice 15 minutes on, 45 minutes off, and use Tylenol/ibuprofen as needed for pain -Follow-up with foot and ankle Center if symptoms persist more than 1 week without improvement or if they worsen despite treatment

## 2021-09-17 NOTE — ED Provider Notes (Signed)
[facility]    CSN: [id number] Arrival date & time: 09/17/21  1119      History   Chief Complaint Chief Complaint  Patient presents with   Ankle Pain    Entered by patient   Appointment    HPI Holly Frazier is a 51 y.o. female.   Patient presents with right ankle pain that began suddenly.  She denies any recent fall, accident, injury, or trauma to the right foot/ankle.  Reports yesterday, she was moving a heavy couch but denies any known injury while moving it, no twisting of her ankle, fall.  Reports the pain is near her ankle bone, radiates around the ankle bone.  Pain is worse with weightbearing.  Has not taken anything for the pain so far.  Denies weakness with weightbearing or walking, morning stiffness, swelling, redness, numbness or tingling in toes, fevers, nausea/vomiting.  Reports there is a small area that is bruised on the top of her foot and this is where the pain starts.    Past Medical History:  Diagnosis Date   Heart murmur     Patient Active Problem List   Diagnosis Date Noted   Encounter for screening colonoscopy 11/05/2020   Obesity (BMI 30-39.9) 08/27/2015    Past Surgical History:  Procedure Laterality Date   ABDOMINAL HYSTERECTOMY     COLONOSCOPY WITH PROPOFOL N/A 12/23/2020   Procedure: COLONOSCOPY WITH PROPOFOL;  Surgeon: Eloise Harman, DO;  [facility]: [facility];  Service: Endoscopy;  Laterality: N/A;  10:30am   POLYPECTOMY  12/23/2020   Procedure: POLYPECTOMY;  Surgeon: Eloise Harman, DO;  [facility];  Service: Endoscopy;;   TONSILLECTOMY      OB History   No obstetric history on file.      Home Medications    Prior to Admission medications   Medication Sig Start Date End Date Taking? Authorizing Provider  alum hydroxide-mag trisilicate (GAVISCON) 52-84 MG CHEW chewable tablet Chew 2 tablets by mouth daily as needed for indigestion or heartburn.    [provider]  cetirizine (ZYRTEC) 10 MG  tablet Take 10 mg by mouth daily.    [provider]  estradiol (ESTRACE) 2 MG tablet Take 2 mg by mouth daily.    [provider]  ibuprofen (ADVIL) 200 MG tablet Take 600 mg by mouth every 6 (six) hours as needed for moderate pain or headache.    [provider]  polyethylene glycol-electrolytes (TRILYTE) 420 g solution Take 4,000 mLs by mouth as directed. 11/18/20   Eloise Harman, DO    Family History Family History  Problem Relation Age of Onset   Colon polyps Mother        hyperplastic   Hypertension Father    Hyperlipidemia Father    Deep vein thrombosis Father    Colon polyps Father        hyperplastic    Social History Social History   Tobacco Use   Smoking status: Former    Years: 20.00    Types: Cigarettes    Quit date: 02/09/2006    Years since quitting: 15.6   Smokeless tobacco: Never  Substance Use Topics   Alcohol use: Not Currently   Drug use: Never     Allergies   Orange fruit [citrus]   Review of Systems Review of Systems Per HPI  Physical Exam Triage Vital Signs ED Triage Vitals [09/17/21 1129]  Enc Vitals Group     BP (!) 175/98  Pulse Rate 86     Resp 20     Temp 98.1 F (36.7 C)     Temp Source Oral     SpO2 93 %     Weight      Height      Head Circumference      Peak Flow      Pain Score 0     Pain Loc      Pain Edu?      Excl. in [place]?    No data found.  Updated Vital Signs BP (!) 175/98 (BP Location: Right Arm)   Pulse 86   Temp 98.1 F (36.7 C) (Oral)   Resp 20   SpO2 93%   Visual Acuity Right Eye Distance:   Left Eye Distance:   Bilateral Distance:    Right Eye Near:   Left Eye Near:    Bilateral Near:     Physical Exam Vitals and nursing note reviewed.  Constitutional:      General: She is not in acute distress.    Appearance: Normal appearance. She is not toxic-appearing.  HENT:     Mouth/Throat:     Mouth: Mucous membranes are moist.     Pharynx: Oropharynx is clear.   Pulmonary:     Effort: Pulmonary effort is normal. No respiratory distress.  Musculoskeletal:     Right ankle: Ecchymosis present. No swelling. Tenderness present over the lateral malleolus, CF ligament and posterior TF ligament. Normal pulse.     Left ankle: Normal.     Right foot: Normal capillary refill. Normal pulse.     Comments: Inspection: No swelling, obvious deformity, or redness to right ankle or foot.  There is a very small area on the medial aspect of dorsal foot that appears to be a bruise.   Palpation: Right ankle tender to palpation in areas marked; no obvious deformities palpated ROM: Range of motion difficult to assess secondary to pain Strength: 5/5 right ankle Neurovascular: neurovascularly intact in right lower extremity   Skin:    General: Skin is warm and dry.     Capillary Refill: Capillary refill takes less than 2 seconds.     Coloration: Skin is not jaundiced or pale.     Findings: No erythema.  Neurological:     Mental Status: She is alert and oriented to person, place, and time.  Psychiatric:        Behavior: Behavior is cooperative.      UC Treatments / Results  Labs (all labs ordered are listed, but only abnormal results are displayed) Labs Reviewed - No data to display  EKG   Radiology DG Ankle Complete Right  Result Date: 09/17/2021 CLINICAL DATA:  Right ankle swelling. EXAM: RIGHT ANKLE - COMPLETE 3+ VIEW COMPARISON:  None Available. FINDINGS: There is no evidence of fracture, or fracture. 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 Plantar calcaneal spur. Mild soft tissue swelling about the ankle. IMPRESSION: 1. No acute fracture or dislocation identified about the right ankle. 2. 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 Electronically Signed   By: Fidela Salisbury M.D.   On: 09/17/2021 11:51    Procedures Procedures (including critical care time)  Medications Ordered in UC Medications - No data to display  Initial  Impression / Assessment and Plan / UC Course  I have reviewed the triage vital signs and the nursing notes.  Pertinent labs & imaging results that were available during my care of the patient were reviewed by me  and considered in my medical decision making (see chart for details).    Patient is a very pleasant, well-appearing 51 year old female presenting for right ankle pain.  X-ray imaging of ankle does not show any acute fracture or bony abnormality.  Discussed supportive care including rest, ice, compression, elevation for the next 1 to 2 weeks as needed.  Can use Tylenol 500 to 1000 mg every 6 hours along with ibuprofen up to 800 mg every 8 hours as needed for pain.  Follow-up with foot and ankle Center if symptoms persist or worsen despite treatment.  The patient was given the opportunity to ask questions.  All questions answered to their satisfaction.  The patient is in agreement to this plan.   Final Clinical Impressions(s) / UC Diagnoses   Final diagnoses:  Acute right ankle pain     Discharge Instructions      - Ankle x-ray today does not show any broken bones; there is likely a sprain around your ankle -Please wear the compression wrap, keep your foot elevated, rest your foot, apply ice 15 minutes on, 45 minutes off, and use Tylenol/ibuprofen as needed for pain -Follow-up with foot and ankle Center if symptoms persist more than 1 week without improvement or if they worsen despite treatment     ED Prescriptions   None    PDMP not reviewed this encounter.   Eulogio Bear, NP 09/17/21 1218

## 2021-09-17 NOTE — ED Triage Notes (Addendum)
Pt reports was moving a couch last night and reports right ankle pain and swelling ever since. Pt able to bear weight but reports pain. Pt denies any known injury.   Mild swelling and bruising noted to lateral side of right ankle/foot.

## 2022-04-07 ENCOUNTER — Ambulatory Visit
Admission: EM | Admit: 2022-04-07 | Discharge: 2022-04-07 | Disposition: A | Discharge status: Home / Self Care | Payer: BC Managed Care – PPO | Attending: Nurse Practitioner | Admitting: Nurse Practitioner

## 2022-04-07 DIAGNOSIS — J0140 Acute pansinusitis, unspecified: Secondary | ICD-10-CM | POA: Diagnosis not present

## 2022-04-07 MED ORDER — FLUCONAZOLE 150 MG PO TABS: 150.0000 mg | ORAL_TABLET | Freq: Once | ORAL | 0 refills | Status: AC

## 2022-04-07 MED ORDER — AMOXICILLIN-POT CLAVULANATE 875-125 MG PO TABS: 1.0000 | ORAL_TABLET | Freq: Two times a day (BID) | ORAL | 0 refills | Status: DC

## 2022-04-07 NOTE — ED Provider Notes (Signed)
[facility]    CSN: [id number] Arrival date & time: 04/07/22  1053      History   Chief Complaint Chief Complaint  Patient presents with   Sore Throat    sore throat, facial pain, earache - Entered by patient    HPI Holly Frazier is a 52 y.o. female.   The history is provided by the patient.    The patient presents with a 5-day history of headache, nasal congestion, facial pain, right ear pain, sore throat and cough.  Patient states sore throat started today.  She denies fever, chills, ear drainage, runny nose, wheezing, shortness of breath, or difficulty breathing.  Patient states that her facial pain is worsening, she states that it hurts to "wear my glasses".  Patient reports she has been taking over-the-counter cough and cold medication for her symptoms with minimal relief.  She is also using Nasacort daily.  She informs she does have a coworker who has been sick for the last several days.  Past Medical History:  Diagnosis Date   Heart murmur     Patient Active Problem List   Diagnosis Date Noted   Encounter for screening colonoscopy 11/05/2020   Obesity (BMI 30-39.9) 08/27/2015    Past Surgical History:  Procedure Laterality Date   ABDOMINAL HYSTERECTOMY     COLONOSCOPY WITH PROPOFOL N/A 12/23/2020   Procedure: COLONOSCOPY WITH PROPOFOL;  Surgeon: Eloise Harman, DO;  [facility]: [facility];  Service: Endoscopy;  Laterality: N/A;  10:30am   POLYPECTOMY  12/23/2020   Procedure: POLYPECTOMY;  Surgeon: Eloise Harman, DO;  Location: [facility];  Service: Endoscopy;;   TONSILLECTOMY      OB History   No obstetric history on file.      Home Medications    Prior to Admission medications   Medication Sig Start Date End Date Taking? Authorizing Provider  amoxicillin-clavulanate (AUGMENTIN) 875-125 MG tablet Take 1 tablet by mouth every 12 (twelve) hours. 04/07/22  Yes Faruq Rosenberger-Warren, Alda Lea, NP  fluconazole (DIFLUCAN) 150 MG tablet Take 1  tablet (150 mg total) by mouth once for 1 dose. 04/07/22 04/07/22 Yes Silvio Sausedo-Warren, Alda Lea, NP  alum hydroxide-mag trisilicate (GAVISCON) [id number] MG CHEW chewable tablet Chew 2 tablets by mouth daily as needed for indigestion or heartburn.    [provider]  cetirizine (ZYRTEC) 10 MG tablet Take 10 mg by mouth daily.    [provider]  estradiol (ESTRACE) 2 MG tablet Take 2 mg by mouth daily.    [provider]  ibuprofen (ADVIL) 200 MG tablet Take 600 mg by mouth every 6 (six) hours as needed for moderate pain or headache.    [provider]  polyethylene glycol-electrolytes (TRILYTE) 420 g solution Take 4,000 mLs by mouth as directed. 11/18/20   Eloise Harman, DO    Family History Family History  Problem Relation Age of Onset   Colon polyps Mother        hyperplastic   Hypertension Father    Hyperlipidemia Father    Deep vein thrombosis Father    Colon polyps Father        hyperplastic    Social History Social History   Tobacco Use   Smoking status: Former    Years: 20.00    Types: Cigarettes    Quit date: 02/09/2006    Years since quitting: 16.1   Smokeless tobacco: Never  Substance Use Topics   Alcohol use: Not Currently   Drug use: Never  Allergies   Orange fruit [citrus]   Review of Systems Review of Systems Per HPI  Physical Exam Triage Vital Signs ED Triage Vitals  Enc Vitals Group     BP 04/07/22 1208 (!) 188/98     Pulse Rate 04/07/22 1208 68     Resp 04/07/22 1208 18     Temp 04/07/22 1208 98.1 F (36.7 C)     Temp Source 04/07/22 1208 Oral     SpO2 04/07/22 1208 96 %     Weight --      Height --      Head Circumference --      Peak Flow --      Pain Score 04/07/22 1207 4     Pain Loc --      Pain Edu? --      Excl. in [place]? --    No data found.  Updated Vital Signs BP (!) 188/98 (BP Location: Right Wrist)   Pulse 68   Temp 98.1 F (36.7 C) (Oral)   Resp 18   SpO2 96%   Visual Acuity Right  Eye Distance:   Left Eye Distance:   Bilateral Distance:    Right Eye Near:   Left Eye Near:    Bilateral Near:     Physical Exam Vitals and nursing note reviewed.  Constitutional:      General: She is not in acute distress.    Appearance: She is well-developed.  HENT:     Head: Normocephalic.     Right Ear: Tympanic membrane and ear canal normal.     Left Ear: Tympanic membrane and ear canal normal.     Nose: Congestion present.     Right Turbinates: Enlarged and swollen.     Left Turbinates: Enlarged and swollen.     Right Sinus: Maxillary sinus tenderness and frontal sinus tenderness present.     Left Sinus: Maxillary sinus tenderness and frontal sinus tenderness present.     Mouth/Throat:     Lips: Pink.     Pharynx: Uvula midline. Posterior oropharyngeal erythema present. No pharyngeal swelling.     Tonsils: No tonsillar exudate.     Comments: Cobblestoning present on posterior oropharynx Eyes:     Conjunctiva/sclera: Conjunctivae normal.     Pupils: Pupils are equal, round, and reactive to light.  Cardiovascular:     Rate and Rhythm: Normal rate and regular rhythm.     Heart sounds: Normal heart sounds.  Pulmonary:     Effort: Pulmonary effort is normal. No respiratory distress.     Breath sounds: Normal breath sounds. No stridor. No wheezing, rhonchi or rales.  Abdominal:     General: Bowel sounds are normal.     Palpations: Abdomen is soft.     Tenderness: There is no abdominal tenderness.  Musculoskeletal:     Cervical back: Normal range of motion.  Lymphadenopathy:     Cervical: No cervical adenopathy.  Skin:    General: Skin is warm and dry.  Neurological:     General: No focal deficit present.     Mental Status: She is alert and oriented to person, place, and time.  Psychiatric:        Mood and Affect: Mood normal.        Behavior: Behavior normal.      UC Treatments / Results  Labs (all labs ordered are listed, but only abnormal results are  displayed) Labs Reviewed - No data to display  EKG   Radiology No  results found.  Procedures Procedures (including critical care time)  Medications Ordered in UC Medications - No data to display  Initial Impression / Assessment and Plan / UC Course  I have reviewed the triage vital signs and the nursing notes.  Pertinent labs & imaging results that were available during my care of the patient were reviewed by me and considered in my medical decision making (see chart for details).  The patient is well-appearing, she is in no acute distress, she is hypertensive, most likely due to the over-the-counter medication she has been taking for her symptoms.  Will treat patient for acute pansinusitis given her worsening facial pain, with sinus pressure and tenderness.  Viral testing is not indicated as it will not change the plan of care.  Augmentin 875/125 mg tablets were provided for the next 7 days.  Patient advised to continue Nasacort at this time.  Patient was given Diflucan 150 mg tablet for yeast infection while taking antibiotic.  Supportive care recommendations were discussed with the patient along with strict indications of when follow-up may be necessary.  Patient is in agreement with this plan of care and verbalizes understanding.  All questions were answered.  Patient stable for discharge.   Final Clinical Impressions(s) / UC Diagnoses   Final diagnoses:  Acute pansinusitis, recurrence not specified     Discharge Instructions      Take medication as directed. Continue your current allergy medication regimen. Increase fluids and get plenty of rest. May take over-the-counter ibuprofen or Tylenol as needed for pain, fever, or general discomfort. Recommend normal saline nasal spray to help with nasal congestion throughout the day. For your cough, it may be helpful to use a humidifier at bedtime during sleep. If your symptoms fail to improve with this treatment, please  follow-up with your primary care physician or in this clinic for further evaluation. Follow-up as needed.     ED Prescriptions     Medication Sig Dispense Auth. Provider   amoxicillin-clavulanate (AUGMENTIN) 875-125 MG tablet Take 1 tablet by mouth every 12 (twelve) hours. 14 tablet Wakeelah Solan-Warren, Alda Lea, NP   fluconazole (DIFLUCAN) 150 MG tablet Take 1 tablet (150 mg total) by mouth once for 1 dose. 1 tablet Jermane Brayboy-Warren, Alda Lea, NP      PDMP not reviewed this encounter.   Tish Men, NP 04/07/22 1236

## 2022-04-07 NOTE — Discharge Instructions (Signed)
Take medication as directed. Continue your current allergy medication regimen. Increase fluids and get plenty of rest. May take over-the-counter ibuprofen or Tylenol as needed for pain, fever, or general discomfort. Recommend normal saline nasal spray to help with nasal congestion throughout the day. For your cough, it may be helpful to use a humidifier at bedtime during sleep. If your symptoms fail to improve with this treatment, please follow-up with your primary care physician or in this clinic for further evaluation. Follow-up as needed.

## 2022-04-07 NOTE — ED Triage Notes (Signed)
Pt reports facial pain, ear pain x 5 days; sore throat started today.

## 2022-09-07 ENCOUNTER — Ambulatory Visit
Admission: EM | Admit: 2022-09-07 | Discharge: 2022-09-07 | Disposition: A | Discharge status: Home / Self Care | Payer: BC Managed Care – PPO | Attending: Nurse Practitioner | Admitting: Nurse Practitioner

## 2022-09-07 ENCOUNTER — Ambulatory Visit: Payer: Self-pay

## 2022-09-07 ENCOUNTER — Telehealth: Payer: Self-pay

## 2022-09-07 DIAGNOSIS — R059 Cough, unspecified: Secondary | ICD-10-CM | POA: Diagnosis present

## 2022-09-07 DIAGNOSIS — Z1152 Encounter for screening for COVID-19: Secondary | ICD-10-CM

## 2022-09-07 DIAGNOSIS — J029 Acute pharyngitis, unspecified: Secondary | ICD-10-CM | POA: Diagnosis not present

## 2022-09-07 DIAGNOSIS — B9789 Other viral agents as the cause of diseases classified elsewhere: Secondary | ICD-10-CM | POA: Insufficient documentation

## 2022-09-07 DIAGNOSIS — J069 Acute upper respiratory infection, unspecified: Secondary | ICD-10-CM | POA: Diagnosis not present

## 2022-09-07 LAB — POCT RAPID STREP A (OFFICE): Rapid Strep A Screen: NEGATIVE

## 2022-09-07 MED ORDER — LIDOCAINE VISCOUS HCL 2 % MT SOLN: OROMUCOSAL | 0 refills | Status: AC

## 2022-09-07 MED ORDER — PSEUDOEPH-BROMPHEN-DM 30-2-10 MG/5ML PO SYRP: 5.0000 mL | ORAL_SOLUTION | Freq: Four times a day (QID) | ORAL | 0 refills | Status: DC | PRN

## 2022-09-07 NOTE — ED Provider Notes (Signed)
[facility]    CSN: [id number] Arrival date & time: 09/07/22  8295      History   Chief Complaint No chief complaint on file.   HPI Holly Frazier is a 52 y.o. female.   The history is provided by the patient.   Patient presents for 1 day history of cough and sore throat.  Patient denies fever, chills, headache, ear pain, ear drainage, nasal congestion, runny nose, wheezing, shortness of breath, difficulty breathing, chest pain, abdominal pain, nausea, vomiting, or diarrhea.  Patient states that she feels like there is something "sitting in her chest".  She reports she has taken Advil and NyQuil for her symptoms.  Patient denies any obvious known sick contacts.  Denies history of asthma or smoking.  Past Medical History:  Diagnosis Date   Heart murmur     Patient Active Problem List   Diagnosis Date Noted   Encounter for screening colonoscopy 11/05/2020   Obesity (BMI 30-39.9) 08/27/2015    Past Surgical History:  Procedure Laterality Date   ABDOMINAL HYSTERECTOMY     COLONOSCOPY WITH PROPOFOL N/A 12/23/2020   Procedure: COLONOSCOPY WITH PROPOFOL;  Surgeon: Lanelle Bal, DO;  Location: [facility];  Service: Endoscopy;  Laterality: N/A;  10:30am   POLYPECTOMY  12/23/2020   Procedure: POLYPECTOMY;  Surgeon: Lanelle Bal, DO;  Location: [facility];  Service: Endoscopy;;   TONSILLECTOMY      OB History   No obstetric history on file.      Home Medications    Prior to Admission medications   Medication Sig Start Date End Date Taking? Authorizing Provider  brompheniramine-pseudoephedrine-DM 30-2-10 MG/5ML syrup Take 5 mLs by mouth 4 (four) times daily as needed. 09/07/22  Yes Ariela Mochizuki-Warren, Sadie Haber, NP  lidocaine (XYLOCAINE) 2 % solution Gargle and spit 5 mL every 6 hours as needed for throat pain or discomfort. 09/07/22  Yes Elspeth Blucher-Warren, Sadie Haber, NP  Multiple Vitamin (MULTIVITAMIN WITH MINERALS) TABS tablet Take 1 tablet by mouth daily.    Yes [provider]  alum hydroxide-mag trisilicate (GAVISCON) 80-20 MG CHEW chewable tablet Chew 2 tablets by mouth daily as needed for indigestion or heartburn.    [provider]  amoxicillin-clavulanate (AUGMENTIN) 875-125 MG tablet Take 1 tablet by mouth every 12 (twelve) hours. 04/07/22   Taner Rzepka-Warren, Sadie Haber, NP  cetirizine (ZYRTEC) 10 MG tablet Take 10 mg by mouth daily.    [provider]  estradiol (ESTRACE) 2 MG tablet Take 2 mg by mouth daily.    [provider]  ibuprofen (ADVIL) 200 MG tablet Take 600 mg by mouth every 6 (six) hours as needed for moderate pain or headache.    [provider]  polyethylene glycol-electrolytes (TRILYTE) 420 g solution Take 4,000 mLs by mouth as directed. 11/18/20   Lanelle Bal, DO    Family History Family History  Problem Relation Age of Onset   Colon polyps Mother        hyperplastic   Hypertension Father    Hyperlipidemia Father    Deep vein thrombosis Father    Colon polyps Father        hyperplastic    Social History Social History   Tobacco Use   Smoking status: Former    Current packs/day: 0.00    Types: Cigarettes    Start date: 02/09/1986    Quit date: 02/09/2006    Years since quitting: 16.5   Smokeless tobacco: Never  Substance Use Topics  Alcohol use: Not Currently   Drug use: Never     Allergies   Orange fruit [citrus]   Review of Systems Review of Systems Per HPI  Physical Exam Triage Vital Signs ED Triage Vitals  Encounter Vitals Group     BP 09/07/22 1005 (!) 158/96     Systolic BP Percentile --      Diastolic BP Percentile --      Pulse Rate 09/07/22 1005 73     Resp 09/07/22 1005 13     Temp 09/07/22 1005 97.9 F (36.6 C)     Temp Source 09/07/22 1005 Oral     SpO2 09/07/22 1005 96 %     Weight --      Height --      Head Circumference --      Peak Flow --      Pain Score 09/07/22 1006 8     Pain Loc --      Pain Education --       Exclude from Growth Chart --    No data found.  Updated Vital Signs BP (!) 158/96   Pulse 73   Temp 97.9 F (36.6 C) (Oral)   Resp 13   SpO2 96%   Visual Acuity Right Eye Distance:   Left Eye Distance:   Bilateral Distance:    Right Eye Near:   Left Eye Near:    Bilateral Near:     Physical Exam Vitals and nursing note reviewed.  Constitutional:      General: She is not in acute distress.    Appearance: Normal appearance.  HENT:     Head: Normocephalic.     Right Ear: Tympanic membrane, ear canal and external ear normal.     Left Ear: Tympanic membrane, ear canal and external ear normal.     Nose: Nose normal.     Right Turbinates: Enlarged and swollen.     Left Turbinates: Enlarged and swollen.     Right Sinus: No maxillary sinus tenderness or frontal sinus tenderness.     Left Sinus: No maxillary sinus tenderness or frontal sinus tenderness.     Mouth/Throat:     Lips: Pink.     Mouth: Mucous membranes are moist.     Pharynx: Uvula midline. Pharyngeal swelling and posterior oropharyngeal erythema present. No oropharyngeal exudate or uvula swelling.     Tonsils: 1+ on the right. 1+ on the left.  Eyes:     Extraocular Movements: Extraocular movements intact.     Conjunctiva/sclera: Conjunctivae normal.     Pupils: Pupils are equal, round, and reactive to light.  Cardiovascular:     Rate and Rhythm: Normal rate and regular rhythm.     Pulses: Normal pulses.     Heart sounds: Normal heart sounds.  Pulmonary:     Effort: Pulmonary effort is normal. No respiratory distress.     Breath sounds: Normal breath sounds. No stridor. No wheezing, rhonchi or rales.  Abdominal:     General: Bowel sounds are normal.     Palpations: Abdomen is soft.     Tenderness: There is no abdominal tenderness.  Musculoskeletal:     Cervical back: Normal range of motion.  Lymphadenopathy:     Cervical: No cervical adenopathy.  Skin:    General: Skin is warm and dry.  Neurological:      General: No focal deficit present.     Mental Status: She is alert and oriented to person, place, and time.  Psychiatric:        Mood and Affect: Mood normal.        Behavior: Behavior normal.      UC Treatments / Results  Labs (all labs ordered are listed, but only abnormal results are displayed) Labs Reviewed  SARS CORONAVIRUS 2 (TAT 6-24 HRS)  CULTURE, GROUP A STREP [facility])  POCT RAPID STREP A (OFFICE)    EKG   Radiology No results found.  Procedures Procedures (including critical care time)  Medications Ordered in UC Medications - No data to display  Initial Impression / Assessment and Plan / UC Course  I have reviewed the triage vital signs and the nursing notes.  Pertinent labs & imaging results that were available during my care of the patient were reviewed by me and considered in my medical decision making (see chart for details).  The patient is well-appearing, she is in no acute distress, vital signs are stable.  Rapid strep test was negative, throat culture and COVID test are pending.  No recent blood work available in the patient's chart, patient is a candidate to receive molnupiravir if her COVID test is positive.  Will provide symptomatic treatment with viscous lidocaine 2% for patient to gargle and spit for throat pain or discomfort, and Bromfed-DM for her cough.  Supportive care recommendations were provided and discussed with the patient to include increasing fluids, allowing for plenty of rest, warm salt water gargles, and a soft diet.  Patient advised to follow-up if symptoms worsen or do not improve over the next 7 to 10 days.  Patient is in agreement with this plan of care and verbalizes understanding.  All questions were answered.  Patient stable for discharge.  Work note was provided.  Final Clinical Impressions(s) / UC Diagnoses   Final diagnoses:  Acute pharyngitis, unspecified etiology  Viral upper respiratory tract infection with cough  Encounter  for screening for COVID-19     Discharge Instructions      Rapid strep test is negative, COVID test and throat culture are pending.  If the pending test results are positive, you will be contacted to discuss treatment. Take medication as prescribed. Increase fluids and allow for plenty of rest. Recommend Tylenol or ibuprofen as needed for pain, fever, or general discomfort. Recommend throat lozenges, Chloraseptic or honey to help with throat pain. Warm salt water gargles 3-4 times daily to help with throat pain or discomfort. Recommend a diet with soft foods to include soups, broths, puddings, yogurt, Jell-O's, or popsicles until symptoms improve. If symptoms do not improve over the next 7 to 10 days, or if they suddenly worsen before that time, please follow-up in this clinic or with your primary care physician for further evaluation. Follow-up as needed.     ED Prescriptions     Medication Sig Dispense Auth. Provider   lidocaine (XYLOCAINE) 2 % solution Gargle and spit 5 mL every 6 hours as needed for throat pain or discomfort. 100 mL Cal Gindlesperger-Warren, Sadie Haber, NP   brompheniramine-pseudoephedrine-DM 30-2-10 MG/5ML syrup Take 5 mLs by mouth 4 (four) times daily as needed. 140 mL Samarth Ogle-Warren, Sadie Haber, NP      PDMP not reviewed this encounter.   Abran Cantor, NP 09/07/22 1021

## 2022-09-07 NOTE — Telephone Encounter (Signed)
Pt pharmacy called stating they do not have the cough syrup that was prescribed in stock but do have an alternative of phenergan DM, provider Lorene Dy states it was ok to switch to that one. Called pt to let know know of the change.

## 2022-09-07 NOTE — Discharge Instructions (Addendum)
Rapid strep test is negative, COVID test and throat culture are pending.  If the pending test results are positive, you will be contacted to discuss treatment. Take medication as prescribed. Increase fluids and allow for plenty of rest. Recommend Tylenol or ibuprofen as needed for pain, fever, or general discomfort. Recommend throat lozenges, Chloraseptic or honey to help with throat pain. Warm salt water gargles 3-4 times daily to help with throat pain or discomfort. Recommend a diet with soft foods to include soups, broths, puddings, yogurt, Jell-O's, or popsicles until symptoms improve. If symptoms do not improve over the next 7 to 10 days, or if they suddenly worsen before that time, please follow-up in this clinic or with your primary care physician for further evaluation. Follow-up as needed.

## 2022-09-07 NOTE — ED Triage Notes (Signed)
Pt c/o sore throat,cough, chest congestion  x 1 day, pt states she feels a heaviness in her chest, difficulty talking And swallowing.

## 2022-09-08 ENCOUNTER — Telehealth (HOSPITAL_COMMUNITY): Payer: Self-pay | Admitting: Emergency Medicine

## 2022-09-08 MED ORDER — MOLNUPIRAVIR EUA 200MG CAPSULE: 4.0000 | ORAL_CAPSULE | Freq: Two times a day (BID) | ORAL | 0 refills | Status: AC

## 2023-05-31 ENCOUNTER — Ambulatory Visit
Admission: RE | Admit: 2023-05-31 | Discharge: 2023-05-31 | Disposition: A | Discharge status: Home / Self Care | Source: Ambulatory Visit | Attending: Nurse Practitioner | Admitting: Nurse Practitioner

## 2023-05-31 VITALS — BP 157/82 | HR 80 | Temp 98.4°F | Resp 18

## 2023-05-31 DIAGNOSIS — R21 Rash and other nonspecific skin eruption: Secondary | ICD-10-CM | POA: Diagnosis not present

## 2023-05-31 MED ORDER — TRIAMCINOLONE ACETONIDE 0.1 % EX OINT: 1.0000 | TOPICAL_OINTMENT | Freq: Two times a day (BID) | CUTANEOUS | 0 refills | Status: AC

## 2023-05-31 NOTE — ED Provider Notes (Signed)
 [facility]    CSN: [id number] Arrival date & time: 05/31/23  1600      History   Chief Complaint Chief Complaint  Patient presents with   Rash    Entered by patient    HPI Holly Frazier is a 53 y.o. female.   Patient presents today with rash to abdomen that she first noticed yesterday.  She reports the rash has not spread or changed from when she first noticed it.  She reports the rash is only itchy when it gets warm.  No shortness of breath or throat/tongue swelling.  No new muscle pain or joint aches.  No recent change in any detergents, soaps, or personal care products.  No recent change in any medications or supplements.  She applied CeraVe to the rash this morning without improvement.    Past Medical History:  Diagnosis Date   Heart murmur     Patient Active Problem List   Diagnosis Date Noted   Encounter for screening colonoscopy 11/05/2020   Obesity (BMI 30-39.9) 08/27/2015    Past Surgical History:  Procedure Laterality Date   ABDOMINAL HYSTERECTOMY     COLONOSCOPY WITH PROPOFOL  N/A 12/23/2020   Procedure: COLONOSCOPY WITH PROPOFOL ;  Surgeon: Vinetta Greening, DO;  Location: [facility];  Service: Endoscopy;  Laterality: N/A;  10:30am   POLYPECTOMY  12/23/2020   Procedure: POLYPECTOMY;  Surgeon: Vinetta Greening, DO;  Location: [facility];  Service: Endoscopy;;   TONSILLECTOMY      OB History   No obstetric history on file.      Home Medications    Prior to Admission medications   Medication Sig Start Date End Date Taking? Authorizing Provider  cetirizine (ZYRTEC) 10 MG tablet Take 10 mg by mouth daily.   Yes [provider]  estradiol (ESTRACE) 2 MG tablet Take 2 mg by mouth daily.   Yes [provider]  Multiple Vitamin (MULTIVITAMIN WITH MINERALS) TABS tablet Take 1 tablet by mouth daily.   Yes [provider]  triamcinolone  ointment (KENALOG ) 0.1 % Apply 1 Application topically 2 (two) times daily.  05/31/23  Yes Wilhemena Harbour, NP  alum hydroxide-mag trisilicate (GAVISCON) 80-20 MG CHEW chewable tablet Chew 2 tablets by mouth daily as needed for indigestion or heartburn.    [provider]  ibuprofen (ADVIL) 200 MG tablet Take 600 mg by mouth every 6 (six) hours as needed for moderate pain or headache.    [provider]  lidocaine  (XYLOCAINE ) 2 % solution Gargle and spit 5 mL every 6 hours as needed for throat pain or discomfort. 09/07/22   Leath-Warren, Belen Bowers, NP  polyethylene glycol-electrolytes (TRILYTE) 420 g solution Take 4,000 mLs by mouth as directed. 11/18/20   Vinetta Greening, DO    Family History Family History  Problem Relation Age of Onset   Colon polyps Mother        hyperplastic   Hypertension Father    Hyperlipidemia Father    Deep vein thrombosis Father    Colon polyps Father        hyperplastic    Social History Social History   Tobacco Use   Smoking status: Former    Current packs/day: 0.00    Types: Cigarettes    Start date: 02/09/1986    Quit date: 02/09/2006    Years since quitting: 17.3   Smokeless tobacco: Never  Vaping Use   Vaping status: Never Used  Substance Use Topics   Alcohol  use: Not Currently   Drug use: Never     Allergies   Orange fruit [citrus]   Review of Systems Review of Systems Per HPI  Physical Exam Triage Vital Signs ED Triage Vitals  Encounter Vitals Group     BP 05/31/23 1606 (!) 157/82     Systolic BP Percentile --      Diastolic BP Percentile --      Pulse Rate 05/31/23 1606 80     Resp 05/31/23 1606 18     Temp 05/31/23 1606 98.4 F (36.9 C)     Temp Source 05/31/23 1606 Oral     SpO2 05/31/23 1606 94 %     Weight --      Height --      Head Circumference --      Peak Flow --      Pain Score 05/31/23 1610 0     Pain Loc --      Pain Education --      Exclude from Growth Chart --    No data found.  Updated Vital Signs BP (!) 157/82 (BP Location: Right Arm)   Pulse 80    Temp 98.4 F (36.9 C) (Oral)   Resp 18   SpO2 94%   Visual Acuity Right Eye Distance:   Left Eye Distance:   Bilateral Distance:    Right Eye Near:   Left Eye Near:    Bilateral Near:     Physical Exam Vitals and nursing note reviewed.  Constitutional:      General: She is not in acute distress.    Appearance: Normal appearance. She is not toxic-appearing.  HENT:     Mouth/Throat:     Mouth: Mucous membranes are moist.     Pharynx: Oropharynx is clear.  Pulmonary:     Effort: Pulmonary effort is normal. No respiratory distress.  Skin:    General: Skin is warm and dry.     Capillary Refill: Capillary refill takes less than 2 seconds.     Findings: Rash present. Rash is macular and papular.     Comments: Maculopapular rash noted to abdomen.  Rash is slightly erythematous.  No active drainage or oozing.  Neurological:     Mental Status: She is alert and oriented to person, place, and time.  Psychiatric:        Behavior: Behavior is cooperative.      UC Treatments / Results  Labs (all labs ordered are listed, but only abnormal results are displayed) Labs Reviewed - No data to display  EKG   Radiology No results found.  Procedures Procedures (including critical care time)  Medications Ordered in UC Medications - No data to display  Initial Impression / Assessment and Plan / UC Course  I have reviewed the triage vital signs and the nursing notes.  Pertinent labs & imaging results that were available during my care of the patient were reviewed by me and considered in my medical decision making (see chart for details).   Patient is well-appearing, afebrile, not tachycardic, not tachypneic, oxygenating well on room air.  Patient is mildly hypertensive in urgent care today.  1. Rash and nonspecific skin eruption Suspect contact dermatitis Oral cetirizine daily for allergies, recommend increasing to twice daily to help with histamine response as well as start  topical steroid ointment-prescription sent to pharmacy ER and return precautions discussed with patient  The patient was given the opportunity to ask questions.  All questions answered to their satisfaction.  The patient is in agreement to this plan.    Final Clinical Impressions(s) / UC Diagnoses   Final diagnoses:  Rash and nonspecific skin eruption     Discharge Instructions      As we discussed, the rash on your abdomen is consistent with contact dermatitis.  Please increase Zyrtec to twice daily and start using the topical steroid ointment twice daily on your skin.  Rash should improve over the next few days.  Please seek care if rash worsens despite treatment or continues to spread after using the ointment for 1 week.  Do not use the ointment on your skin for more than 2 weeks consecutively.    ED Prescriptions     Medication Sig Dispense Auth. Provider   triamcinolone  ointment (KENALOG ) 0.1 % Apply 1 Application topically 2 (two) times daily. 15 g Wilhemena Harbour, NP      PDMP not reviewed this encounter.   Wilhemena Harbour, NP 05/31/23 [phone]

## 2023-05-31 NOTE — ED Triage Notes (Signed)
 Rash on abdomin that started yesterday. Pt states it is a little itchy, but only when she gets hot.

## 2023-05-31 NOTE — Discharge Instructions (Signed)
 As we discussed, the rash on your abdomen is consistent with contact dermatitis.  Please increase Zyrtec to twice daily and start using the topical steroid ointment twice daily on your skin.  Rash should improve over the next few days.  Please seek care if rash worsens despite treatment or continues to spread after using the ointment for 1 week.  Do not use the ointment on your skin for more than 2 weeks consecutively.
